# Patient Record
Sex: Female | Born: 1935 | Race: White | Hispanic: No | Smoking: Never smoker
Health system: Southern US, Community
[De-identification: ages and names within clinical notes are randomized; demographics above are authoritative.]

## PROBLEM LIST (undated history)

## (undated) DIAGNOSIS — M4800 Spinal stenosis, site unspecified: Secondary | ICD-10-CM

## (undated) DIAGNOSIS — G894 Chronic pain syndrome: Secondary | ICD-10-CM

## (undated) DIAGNOSIS — G20A1 Parkinson's disease without dyskinesia, without mention of fluctuations: Secondary | ICD-10-CM

## (undated) DIAGNOSIS — G629 Polyneuropathy, unspecified: Secondary | ICD-10-CM

## (undated) DIAGNOSIS — F0390 Unspecified dementia without behavioral disturbance: Secondary | ICD-10-CM

## (undated) DIAGNOSIS — I10 Essential (primary) hypertension: Secondary | ICD-10-CM

## (undated) DIAGNOSIS — K589 Irritable bowel syndrome without diarrhea: Secondary | ICD-10-CM

## (undated) DIAGNOSIS — M810 Age-related osteoporosis without current pathological fracture: Secondary | ICD-10-CM

## (undated) DIAGNOSIS — I639 Cerebral infarction, unspecified: Secondary | ICD-10-CM

## (undated) DIAGNOSIS — G20 Parkinson's disease: Secondary | ICD-10-CM

## (undated) HISTORY — DX: Essential (primary) hypertension: I10

## (undated) HISTORY — DX: Unspecified dementia, unspecified severity, without behavioral disturbance, psychotic disturbance, mood disturbance, and anxiety: F03.90

## (undated) HISTORY — DX: Cerebral infarction, unspecified: I63.9

## (undated) HISTORY — PX: ABDOMINAL HYSTERECTOMY: SHX81

## (undated) HISTORY — DX: Parkinson's disease: G20

## (undated) HISTORY — DX: Age-related osteoporosis without current pathological fracture: M81.0

## (undated) HISTORY — DX: Irritable bowel syndrome, unspecified: K58.9

## (undated) HISTORY — DX: Spinal stenosis, site unspecified: M48.00

## (undated) HISTORY — DX: Polyneuropathy, unspecified: G62.9

## (undated) HISTORY — DX: Chronic pain syndrome: G89.4

## (undated) HISTORY — DX: Parkinson's disease without dyskinesia, without mention of fluctuations: G20.A1

## (undated) HISTORY — PX: TUBAL LIGATION: SHX77

## (undated) HISTORY — PX: TONSILLECTOMY: SUR1361

---

## 2014-06-02 ENCOUNTER — Ambulatory Visit (INDEPENDENT_AMBULATORY_CARE_PROVIDER_SITE_OTHER): Payer: Medicare Other | Admitting: Podiatry

## 2014-06-02 ENCOUNTER — Encounter: Payer: Self-pay | Admitting: Podiatry

## 2014-06-02 VITALS — BP 146/87 | HR 79 | Resp 15 | Ht 60.0 in | Wt 114.0 lb

## 2014-06-02 DIAGNOSIS — B351 Tinea unguium: Secondary | ICD-10-CM | POA: Diagnosis not present

## 2014-06-02 DIAGNOSIS — M79676 Pain in unspecified toe(s): Secondary | ICD-10-CM

## 2014-06-02 NOTE — Progress Notes (Signed)
   Subjective:    Patient ID: Penny Robbins, female    DOB: [birth year], 81 y.o.   MRN: [id number]  HPI Comments: N debridement L 10 toenails D and O long-term C elongated, tender toenails A Parkinson's disease, peripheral neuropathy T hx of pedicare by orthopedic in October 2015   Patient has [place] to [place] and living in assisted living facility History of podiatric care while living in [place]  Review of Systems  All other systems reviewed and are negative.      Objective:   Physical Exam   Orientated 3 Patient transfer from wheelchair to treatment chair  Vascular: DP pulses 2/4 bilaterally PT pulses 0/4 bilaterally  Neurological: Sensation to 10 g monofilament wire intact 3/5 right and 5/5 left Vibratory sensation nonreactive bilaterally Ankle reflex equal and reactive bilaterally  Dermatological: The toenails are elongated, hypertrophic, incurvated and tender to palpation 6-10  Musculoskeletal: Hammertoe deformities 2-5 bilaterally Manual motor testing dorsi flexion and plantar flexion 5/5 bilaterally Unstable gait      Assessment & Plan:   Assessment: Peripheral neuropathy Diminished pedal pulses suggestive of possible peripheral arterial disease bilaterally Symptomatic onychomycoses 6-10  Plan: Debridement toenails 10 without a bleeding  Reappoint 3 months

## 2014-07-07 ENCOUNTER — Encounter: Payer: Self-pay | Admitting: *Deleted

## 2014-07-08 ENCOUNTER — Ambulatory Visit (INDEPENDENT_AMBULATORY_CARE_PROVIDER_SITE_OTHER): Payer: Medicare Other | Admitting: Neurology

## 2014-07-08 ENCOUNTER — Encounter: Payer: Self-pay | Admitting: Neurology

## 2014-07-08 VITALS — BP 102/61 | HR 80 | Ht 60.0 in | Wt 113.0 lb

## 2014-07-08 DIAGNOSIS — G20 Parkinson's disease: Secondary | ICD-10-CM

## 2014-07-08 DIAGNOSIS — R269 Unspecified abnormalities of gait and mobility: Secondary | ICD-10-CM | POA: Insufficient documentation

## 2014-07-08 DIAGNOSIS — F03918 Unspecified dementia, unspecified severity, with other behavioral disturbance: Secondary | ICD-10-CM | POA: Insufficient documentation

## 2014-07-08 DIAGNOSIS — F0391 Unspecified dementia with behavioral disturbance: Secondary | ICD-10-CM

## 2014-07-08 MED ORDER — QUETIAPINE FUMARATE 25 MG PO TABS: ORAL_TABLET | ORAL | Status: DC

## 2014-07-08 NOTE — Progress Notes (Signed)
PATIENT: Penny GumJoanna Eager DOB: Oct 05, 1924  HISTORICAL  Penny Robbins is a 81 yo RH female referred by her PCP Dr, Nadyne CoombesKaren Richter, accompanied by her son Penny Robbins to establish neurological care  She has a history of Parkinson's disease, diagnosis was made in early 1990s, she presented with right hand tremor, slow progression of the years, she has developed gradual worsening gait difficulty, since she fell, broke her left hip around 2013, she had much worsening gait difficulty, went from ambulate without assistant to walker, now require wheelchair for longer distance  She had 16 years of education, was a housewife, used to live in [place] with her husband, who passed away in October 2015, she [facility],   She was under the care of neurologist at [place], is taking Sinemet 25 mg/100 mg 3 times a day, over the past few months, she was noted to have increased gait difficulty, tendency to fall,  She also had a gradual onset memory trouble over the years, visual hallucination, to the point of calling 911 to her house, she continue have almost daily visual hallucinations, sometimes scary nightmares, could not tell reality from her dreams,  She has good appetite, evening time sundowning, mild to sleeping difficulty, nocturia, she was given gabapentin 100 mg every night few weeks ago, complains of mental foggy, wet her bed for the first time,  REVIEW OF SYSTEMS: Full 14 system review of systems performed and notable only for as above  ALLERGIES: Allergies  Allergen Reactions  . Benicar [Olmesartan]   . Paxil [Paroxetine Hcl]   . Vasotec [Enalaprilat]     HOME MEDICATIONS: Current Outpatient Prescriptions  Medication Sig  . amLODipine (NORVASC) 5 MG tablet Take 5 mg by mouth daily.  Marland Kitchen. aspirin 81 MG tablet Take by mouth daily.  . calcium carbonate (TUMS - DOSED IN MG ELEMENTAL CALCIUM) 500 MG chewable tablet Chew 1 tablet by mouth daily.  .  Calcium-Vitamin D-Vitamin K (VIACTIV PO) Take by mouth.  . carbidopa-levodopa (SINEMET IR) 25-100 MG per tablet Take 1 tablet by mouth daily.   . carvedilol (COREG) 12.5 MG tablet Take 12.5 mg by mouth 3 (three) times daily.   . Cholecalciferol (VITAMIN D PO) Take 1,000 mg by mouth.   . gabapentin (NEURONTIN) 100 MG capsule Take 100 mg by mouth at bedtime.  Marland Kitchen. glycerin adult 2 G SUPP Place 1 suppository rectally as needed for moderate constipation.  Marland Kitchen. LORazepam (ATIVAN) 0.5 MG tablet Take 0.5 mg by mouth every 8 (eight) hours as needed.   . Multiple Vitamins-Minerals (I-VITE PO) Take by mouth.  Marland Kitchen. oxymorphone (OPANA) 5 MG tablet Take 5 mg by mouth every 12 (twelve) hours.  . pantoprazole (PROTONIX) 20 MG tablet Take 20 mg by mouth daily.  . polyethylene glycol (MIRALAX / GLYCOLAX) packet Take 17 g by mouth daily.  . potassium chloride (K-DUR,KLOR-CON) 10 MEQ tablet Take 10 mEq by mouth daily.   . potassium chloride (MICRO-K) 10 MEQ CR capsule Take 10 mEq by mouth 2 (two) times daily.  . vitamin B-12 (CYANOCOBALAMIN) 500 MCG tablet Take 500 mcg by mouth daily.     PAST MEDICAL HISTORY: Past Medical History  Diagnosis Date  . Hypertension   . Osteoporosis   . IBS (irritable bowel syndrome)   . Parkinson disease   . Peripheral neuropathy   . Chronic pain syndrome   . Spinal stenosis   . Dementia     PAST SURGICAL HISTORY: Past Surgical  History  Procedure Laterality Date  . Abdominal hysterectomy    . Tubal ligation    . Tonsillectomy      FAMILY HISTORY: No family history on file.  SOCIAL HISTORY:  History   Social History  . Marital Status: Widowed    Spouse Name: N/A  . Number of Children: 3  . Years of Education: 14   Occupational History  . House wife   Social History Main Topics  . Smoking status: Never Smoker   . Smokeless tobacco: Not on file  . Alcohol Use: Not on file  . Drug Use: Not on file  . Sexual Activity: Not on file   Other Topics Concern  .  Not on file    PHYSICAL EXAM   Filed Vitals:   07/08/14 1042  BP: 102/61  Pulse: 80  Height: 5' (1.524 m)  Weight: 113 lb (51.256 kg)    Not recorded      Body mass index is 22.07 kg/(m^2).  PHYSICAL EXAMNIATION:  Gen: NAD, conversant, well nourised, obese, well groomed                     Cardiovascular: Regular rate rhythm, no peripheral edema, warm, nontender. Eyes: Conjunctivae clear without exudates or hemorrhage Neck: Supple, no carotid bruise. Pulmonary: Clear to auscultation bilaterally   NEUROLOGICAL EXAM:  MENTAL STATUS: Speech:    Speech is normal; fluent and spontaneous with normal comprehension.  Cognition:    The patient is oriented to person, place, and time;     recent and remote memory intact;     language fluent;     normal attention, concentration,     fund of knowledge.  CRANIAL NERVES: CN II: Visual fields are full to confrontation. Fundoscopic exam is normal with sharp discs and no vascular changes. Venous pulsations are present bilaterally. Pupils are 4 mm and briskly reactive to light. Visual acuity is 20/20 bilaterally. CN III, IV, VI: extraocular movement are normal. No ptosis. CN V: Facial sensation is intact to pinprick in all 3 divisions bilaterally. Corneal responses are intact.  CN VII: Face is symmetric with normal eye closure and smile. CN VIII: Hearing is normal to rubbing fingers CN IX, X: Palate elevates symmetrically. Phonation is normal. CN XI: Head turning and shoulder shrug are intact CN XII: Tongue is midline with normal movements and no atrophy.  MOTOR: She has mild limb and nuchal rigidity, right more than left, no significant weakness, while specificity,   REFLEXES: Reflexes are 2+ and symmetric at the biceps, triceps, knees, and ankles. Plantar responses are flexor.  SENSORY: Length dependent decreased Light touch, pinprick, position sense, and vibration sense at toes   COORDINATION: Rapid alternating movements  and fine finger movements are intact. There is no dysmetria on finger-to-nose and heel-knee-shin. There are no abnormal or extraneous movements.   GAIT/STANCE:  She need assistant to get up from seated position, cautious, mildly unsteady,   DIAGNOSTIC DATA (LABS, IMAGING, TESTING) - I reviewed patient records, labs, notes, testing and imaging myself where available.    ASSESSMENT AND PLAN  Dawsyn Zurn is a 81 y.o. female  with long-standing history of Parkinson's disease, progressive worsening gait difficulty, worsening memory trouble, sundowning phenomenon, visual hallucinations, difficulty sleeping,  1, moderate exercise, physical therapy 2, keep current dose of Sinemet 25/100 mg 3 times a day 3. Seroquel 25 mg half to one tablets every night 4, return to clinic in one month    No orders of the  defined types were placed in this encounter.    New Prescriptions   QUETIAPINE (SEROQUEL) 25 MG TABLET    1/2 tab po qhs xone week, then one tab po qhs    There are no discontinued medications.  Return in about 1 month (around 08/07/2014). Levert Feinstein, M.D. Ph.D.  [facility] [address], [address] [place], [place] [address] Ph: [phone] Fax: [phone]

## 2014-07-15 ENCOUNTER — Emergency Department (HOSPITAL_COMMUNITY): Payer: Medicare Other

## 2014-07-15 ENCOUNTER — Emergency Department (HOSPITAL_COMMUNITY)
Admission: EM | Admit: 2014-07-15 | Discharge: 2014-07-15 | Disposition: A | Discharge status: Home / Self Care | Payer: Medicare Other | Attending: Emergency Medicine | Admitting: Emergency Medicine

## 2014-07-15 ENCOUNTER — Encounter (HOSPITAL_COMMUNITY): Payer: Self-pay | Admitting: Emergency Medicine

## 2014-07-15 DIAGNOSIS — Z79899 Other long term (current) drug therapy: Secondary | ICD-10-CM | POA: Diagnosis not present

## 2014-07-15 DIAGNOSIS — Y9289 Other specified places as the place of occurrence of the external cause: Secondary | ICD-10-CM | POA: Diagnosis not present

## 2014-07-15 DIAGNOSIS — S4991XA Unspecified injury of right shoulder and upper arm, initial encounter: Secondary | ICD-10-CM | POA: Diagnosis present

## 2014-07-15 DIAGNOSIS — Y9389 Activity, other specified: Secondary | ICD-10-CM | POA: Insufficient documentation

## 2014-07-15 DIAGNOSIS — Y998 Other external cause status: Secondary | ICD-10-CM | POA: Insufficient documentation

## 2014-07-15 DIAGNOSIS — W010XXA Fall on same level from slipping, tripping and stumbling without subsequent striking against object, initial encounter: Secondary | ICD-10-CM | POA: Insufficient documentation

## 2014-07-15 DIAGNOSIS — Z7982 Long term (current) use of aspirin: Secondary | ICD-10-CM | POA: Diagnosis not present

## 2014-07-15 DIAGNOSIS — I10 Essential (primary) hypertension: Secondary | ICD-10-CM | POA: Insufficient documentation

## 2014-07-15 DIAGNOSIS — S42001A Fracture of unspecified part of right clavicle, initial encounter for closed fracture: Secondary | ICD-10-CM | POA: Insufficient documentation

## 2014-07-15 DIAGNOSIS — Z8673 Personal history of transient ischemic attack (TIA), and cerebral infarction without residual deficits: Secondary | ICD-10-CM | POA: Diagnosis not present

## 2014-07-15 DIAGNOSIS — S81801A Unspecified open wound, right lower leg, initial encounter: Secondary | ICD-10-CM | POA: Diagnosis not present

## 2014-07-15 DIAGNOSIS — G629 Polyneuropathy, unspecified: Secondary | ICD-10-CM | POA: Insufficient documentation

## 2014-07-15 DIAGNOSIS — G3183 Dementia with Lewy bodies: Secondary | ICD-10-CM | POA: Insufficient documentation

## 2014-07-15 DIAGNOSIS — G894 Chronic pain syndrome: Secondary | ICD-10-CM | POA: Diagnosis not present

## 2014-07-15 DIAGNOSIS — Z792 Long term (current) use of antibiotics: Secondary | ICD-10-CM | POA: Diagnosis not present

## 2014-07-15 DIAGNOSIS — S81811A Laceration without foreign body, right lower leg, initial encounter: Secondary | ICD-10-CM

## 2014-07-15 DIAGNOSIS — W19XXXA Unspecified fall, initial encounter: Secondary | ICD-10-CM

## 2014-07-15 MED ORDER — HYDROCODONE-ACETAMINOPHEN 5-325 MG PO TABS: 1.0000 | ORAL_TABLET | Freq: Four times a day (QID) | ORAL | Status: AC | PRN

## 2014-07-15 NOTE — Progress Notes (Signed)
CSW was notified by nurse that the pt is up for discharge and that the family would like to speak with her.  CSW spoke with pt and family at bedside. Son confirms that [facility]. Also, he confirms [facility]. He states that the is the 1st time the pt has fallen within the past 6 months.   Son informed CSW that the pt has a fractured clavicle and that her arm is in a sling. Son expressed that he is concerned that the pt will not be able to receive the appropriate care [facility]. He states that he feels as though the pt would receive [facility].  CSW informed son that she would reach out to the facility and share his concerns.  CSW called the facility who informed CSW that they would be able to take care of the pt with a fractured clavicle. They also informed CSW that OT and PT could be ordered. [facility] to PT and OT being ordered for the pt.  CSW gave the family a list of SNF and ALF. Son informed CSW that he is the pt's primary support and POA and that he lives in [place].  Son/Donnie [phone]  Trish MageBrittney Kayceon Oki, LCSWA [phone] ED CSW 07/15/2014 11:28 PM

## 2014-07-15 NOTE — ED Provider Notes (Signed)
CSN: [id number]     Arrival date & time 07/15/14  1359 History   First MD Initiated Contact with Patient 07/15/14 1511     Chief Complaint  Patient presents with  . Fall     (Consider location/radiation/quality/duration/timing/severity/associated sxs/prior Treatment) HPI Patient presents to the emergency department following a fall that occurred just prior to arrival.  Patient states that she tripped over a threshold of a doorway in her room at the assisted living facility.  The patient states she does not believe she hit her head and did not lose consciousness.  Patient, states she is having pain in her right shoulder and right lower leg.  Patient states that she did not take any medications prior to arrival.  Patient states that she is a walker for ambulation.  Patient denies chest pain, shortness of breath, headache, blurred vision, neck pain, back pain, nausea, vomiting, or syncope.  The patient states that movement and palpation make her pain worse Past Medical History  Diagnosis Date  . Hypertension   . Stroke   . Osteoporosis   . IBS (irritable bowel syndrome)   . Parkinson disease   . Peripheral neuropathy   . Chronic pain syndrome   . Spinal stenosis   . Dementia    Past Surgical History  Procedure Laterality Date  . Abdominal hysterectomy    . Tubal ligation    . Tonsillectomy     Family History  Problem Relation Age of Onset  . Healthy Mother   . Tuberculosis Father    History  Substance Use Topics  . Smoking status: Never Smoker   . Smokeless tobacco: Not on file  . Alcohol Use: No   OB History    No data available     Review of Systems  All other systems negative except as documented in the HPI. All pertinent positives and negatives as reviewed in the HPI.  Allergies  Benicar; Paxil; and Vasotec  Home Medications   Prior to Admission medications   Medication Sig Start Date End Date Taking? Authorizing Provider  amLODipine (NORVASC) 5 MG tablet Take  5 mg by mouth 2 (two) times daily.    Yes Historical Provider, MD  ARIPiprazole (ABILIFY) 2 MG tablet Take 1 mg by mouth at bedtime.   Yes Historical Provider, MD  aspirin 81 MG chewable tablet Chew 81 mg by mouth daily after breakfast.   Yes Historical Provider, MD  bisacodyl (DULCOLAX) 10 MG suppository Place 10 mg rectally daily as needed for moderate constipation.   Yes Historical Provider, MD  carbidopa-levodopa (SINEMET IR) 25-100 MG per tablet Take 1 tablet by mouth 3 (three) times daily.    Yes Historical Provider, MD  carvedilol (COREG) 12.5 MG tablet Take 12.5 mg by mouth 2 (two) times daily with a meal.    Yes Historical Provider, MD  Cholecalciferol (VITAMIN D3 SUPER STRENGTH PO) Take 2,000 mg by mouth daily after breakfast.   Yes Historical Provider, MD  clindamycin (CLEOCIN T) 1 % lotion Apply 1 application topically 2 (two) times daily. Self administers   Yes Historical Provider, MD  Cyanocobalamin (VITAMIN B-12) 1000 MCG SUBL Place 1,000 mcg under the tongue daily.   Yes Historical Provider, MD  docusate sodium (COLACE) 100 MG capsule Take 100 mg by mouth 2 (two) times daily.   Yes Historical Provider, MD  famotidine (PEPCID) 20 MG tablet Take 20 mg by mouth 2 (two) times daily.   Yes Historical Provider, MD  gabapentin (NEURONTIN) 100 MG capsule  Take 200 mg by mouth at bedtime.    Yes Historical Provider, MD  glycerin adult 2 G SUPP Place 1 suppository rectally as needed for moderate constipation.   Yes Historical Provider, MD  LORazepam (ATIVAN) 0.5 MG tablet Take 0.5 mg by mouth every 8 (eight) hours as needed.    Yes Historical Provider, MD  meclizine (ANTIVERT) 12.5 MG tablet Take 12.5 mg by mouth every 8 (eight) hours as needed for dizziness.   Yes Historical Provider, MD  Multiple Vitamins-Minerals (I-VITE PO) Take 1 tablet by mouth daily after breakfast.    Yes Historical Provider, MD  oxymorphone (OPANA ER) 5 MG 12 hr tablet Take 5 mg by mouth every 12 (twelve) hours.   Yes  Historical Provider, MD  polyethylene glycol powder (GLYCOLAX/MIRALAX) powder Take 17 g by mouth 2 (two) times daily as needed for mild constipation.   Yes Historical Provider, MD  potassium chloride (MICRO-K) 10 MEQ CR capsule Take 10-20 mEq by mouth daily after breakfast. Takes 10 mEq Sunday, Tuesday, Thursday, and Saturday  Takes 20 mEq Monday, Wednesday, and Friday   Yes Historical Provider, MD  psyllium (METAMUCIL) 58.6 % powder Take 1 packet by mouth daily.   Yes Historical Provider, MD  QUEtiapine (SEROQUEL) 25 MG tablet 1/2 tab po qhs xone week, then one tab po qhs Patient taking differently: Take 12.5-25 mg by mouth at bedtime. 1/2 tab po qhs xone week, then one tab po qhs 07/08/14  Yes Levert Feinstein, MD   BP 160/78 mmHg  Pulse 70  Temp(Src) 98 F (36.7 C) (Oral)  Resp 18  SpO2 98% Physical Exam  Constitutional: She is oriented to person, place, and time. She appears well-developed and well-nourished. No distress.  HENT:  Head: Normocephalic and atraumatic.  Mouth/Throat: Oropharynx is clear and moist.  Eyes: EOM are normal. Pupils are equal, round, and reactive to light.  Neck: Normal range of motion. Neck supple.  Cardiovascular: Normal rate, regular rhythm and normal heart sounds.  Exam reveals no gallop and no friction rub.   No murmur heard. Pulmonary/Chest: Breath sounds normal. No respiratory distress. She exhibits no tenderness.  Musculoskeletal:       Right shoulder: She exhibits decreased range of motion, tenderness and pain. She exhibits no swelling.       Legs: Neurological: She is alert and oriented to person, place, and time. She exhibits normal muscle tone. Coordination normal.  Skin: Skin is warm and dry. No rash noted. No erythema.  Nursing note and vitals reviewed.   ED Course  Procedures (including critical care time) Labs Review Labs Reviewed - No data to display  Imaging Review Dg Pelvis 1-2 Views  07/15/2014   CLINICAL DATA:  Unwitnessed fall.  No loss  of consciousness.  EXAM: PELVIS - 1-2 VIEW  COMPARISON:  None.  FINDINGS: Old left proximal femoral fracture transfixed with an intra medullary nail and interlocking femoral neck screw without failure or complication. No acute fracture or dislocation. No lytic or sclerotic osseous lesion. Levocurvature of the lumbar spine. Generalized osteopenia.  IMPRESSION: 1. No acute osseous injury of the pelvis.   Electronically Signed   By: Elige Ko   On: 07/15/2014 16:12   Dg Shoulder Right  07/15/2014   CLINICAL DATA:  Unwitnessed fall, slipped on carpet, no loss of consciousness  EXAM: RIGHT SHOULDER - 2+ VIEW  COMPARISON:  None  FINDINGS: Osseous demineralization.  Question displaced distal RIGHT clavicular fracture, region poorly visualized.  Glenohumeral alignment normal.  No glenohumeral  fracture or dislocation.  Visualized RIGHT ribs intact.  Prior spinal augmentation procedures at 4 contiguous levels of the thoracic spine.  IMPRESSION: Osseous demineralization with question minimally displaced distal RIGHT clavicular fracture ; recommend correlation for pain/tenderness at this site.   Electronically Signed   By: Ulyses SouthwardMark  Boles M.D.   On: 07/15/2014 16:12   Dg Tibia/fibula Right  07/15/2014   CLINICAL DATA:  Per EMS patient [facility] unwitnessed [facility]. Slipped on the carpet. No loss of consciousness. Patient recalls the fall. Right ankle and right shoulder pain. History of osteoporosis and peripheral neuropathy.  EXAM: RIGHT TIBIA AND FIBULA - 2 VIEW  COMPARISON:  None.  FINDINGS: Bones appear osteopenic. There is no evidence for acute fracture or dislocation. There is diffuse soft tissue swelling. There is atherosclerotic calcification of the arteries.  IMPRESSION: 1. Soft tissue swelling. 2.  No evidence for acute osseous abnormality.   Electronically Signed   By: Norva PavlovElizabeth  Brown M.D.   On: 07/15/2014 16:16   Ct Head Wo Contrast  07/15/2014   CLINICAL DATA:  [facility]. Pt  had unwitnessed fall. Patient slipped on carpet, no LOC and pt remembers fall.  EXAM: CT HEAD WITHOUT CONTRAST  CT CERVICAL SPINE WITHOUT CONTRAST  TECHNIQUE: Multidetector CT imaging of the head and cervical spine was performed following the standard protocol without intravenous contrast. Multiplanar CT image reconstructions of the cervical spine were also generated.  COMPARISON:  None.  FINDINGS: CT HEAD FINDINGS  There is moderate central and cortical atrophy. Periventricular white matter changes are consistent with small vessel disease. There is no intra or extra-axial fluid collection or mass lesion. The basilar cisterns and ventricles have a normal appearance. There is no CT evidence for acute infarction or hemorrhage. There is a laceration involving the right frontal scalp. No underlying calvarial fracture. Significant atherosclerotic calcification of the internal carotid arteries. Paranasal and mastoid air cells are normally aerated.  CT CERVICAL SPINE FINDINGS  There is moderate mid cervical degenerative change. There is 2 mm of anterolisthesis C3 on C4, likely degenerative. Significant disc height loss identified at C4-5, C5-6, C6-7, and C7-T1. There is significant facet hypertrophy at these same levels. No evidence for acute fracture or traumatic subluxation. Lung apices show fibrotic changes and focal air trapping. There is significant atherosclerotic calcification of the aortic arch and its branches. Significant carotid bulb calcification bilaterally, left greater than right.  IMPRESSION: 1. Atrophy and small vessel disease. 2. Right frontal scalp laceration.  No calvarial fracture. 3. Moderate cervical spondylosis. 4.  No evidence for acute cervical spine abnormality. 5. Moderate atherosclerosis.   Electronically Signed   By: Norva PavlovElizabeth  Brown M.D.   On: 07/15/2014 16:31   Ct Cervical Spine Wo Contrast  07/15/2014   CLINICAL DATA:  [facility] Pt had unwitnessed fall. Patient slipped  on carpet, no LOC and pt remembers fall.  EXAM: CT HEAD WITHOUT CONTRAST  CT CERVICAL SPINE WITHOUT CONTRAST  TECHNIQUE: Multidetector CT imaging of the head and cervical spine was performed following the standard protocol without intravenous contrast. Multiplanar CT image reconstructions of the cervical spine were also generated.  COMPARISON:  None.  FINDINGS: CT HEAD FINDINGS  There is moderate central and cortical atrophy. Periventricular white matter changes are consistent with small vessel disease. There is no intra or extra-axial fluid collection or mass lesion. The basilar cisterns and ventricles have a normal appearance. There is no CT evidence for acute infarction or hemorrhage. There is  a laceration involving the right frontal scalp. No underlying calvarial fracture. Significant atherosclerotic calcification of the internal carotid arteries. Paranasal and mastoid air cells are normally aerated.  CT CERVICAL SPINE FINDINGS  There is moderate mid cervical degenerative change. There is 2 mm of anterolisthesis C3 on C4, likely degenerative. Significant disc height loss identified at C4-5, C5-6, C6-7, and C7-T1. There is significant facet hypertrophy at these same levels. No evidence for acute fracture or traumatic subluxation. Lung apices show fibrotic changes and focal air trapping. There is significant atherosclerotic calcification of the aortic arch and its branches. Significant carotid bulb calcification bilaterally, left greater than right.  IMPRESSION: 1. Atrophy and small vessel disease. 2. Right frontal scalp laceration.  No calvarial fracture. 3. Moderate cervical spondylosis. 4.  No evidence for acute cervical spine abnormality. 5. Moderate atherosclerosis.   Electronically Signed   By: Norva Pavlov M.D.   On: 07/15/2014 16:31   I repaired the skin tear with Steri-Strips.  I advised the patient to follow-up with her primary care doctor and orthopedics   The patient lives in an  assisted-living and the case manager saw the patient and gave him a home health follow-up with OT and PT. Marland Kitchen  Patient is advised follow-up with her primary care Dr. told to return here as needed  Charlestine Night, PA-C 07/16/14 0003  Eber Hong, MD 07/16/14 1124

## 2014-07-15 NOTE — ED Provider Notes (Signed)
The patient is an 81 year old female who had a fall, she complains of right shoulder pain, has some abrasions and skin tears to her arms and legs, on exam she has tenderness over the distal third of the right clavicle, x-rays confirm clavicle fracture, sling, back to her nursing facility, no other indications for further imaging. The patient has a normal mental status, clear heart and lung sounds and has normal neurologic exam. Family members informed of results and indications for return.  Medical screening examination/treatment/procedure(s) were conducted as a shared visit with non-physician practitioner(s) and myself.  I personally evaluated the patient during the encounter.  Clinical Impression:   Final diagnoses:  Fall, initial encounter  Skin tear of lower leg without complication, right, initial encounter  Clavicle fracture, right, closed, initial encounter         Eber HongBrian Wendee Hata, MD 07/16/14 1123

## 2014-07-15 NOTE — Progress Notes (Addendum)
[facility] as patient's son reports patient needs to go to a SNF.  Patient is 81 year old female who fell at nursing ALF [facility] resulting in fractured clavicle.  Sling has been applied in ED.  Patient usually ambulates with a walker at ALF.  EDSW has called facility and per facility staff, they will be able to take care of this patient with fractured clavicle and sling and that they will require an order for PT if needed, that facility will provide therapy for patient.   EDCM and EDSW spoke to patient and patient's family at bedside.  Patient's son Donnie at bedside.   Patient's son agreeable to PT OT at facility.  EDCM assessed for dme needs.  Patient's son reports they have a wheelchair for patient and patient has a walker.  Patient's son agreeable to have bedside commode delivered to [facility].  No other dme needs at this time. Encouraged patient's son to have wheelchair brought to facility. Patient's [facility]  EDCM explained Medicare guidelines regarding approval for SNF.  EDCM provided patient's son with list of private duty nursing agencies, explained services and that it would be an out of pocket expense.  Discussed patient with EDP who placed orders for PT,OT.  Orders placed with patient's chart with paper clip to attach to other paperwork to go back to facility.  [facility] with confirmation of receipt at 1914pm.  No further EDCM needs at this time.  07/16/2014 A. Brigid Vandekamp RNCM 1812pm  EDCM called patient's son Dema SeverinDonny for follow up.  EDCM asked patient's son how his mother was doing?  Patient's son responded, "She's actually doing pretty well."  Patient's son reports staff at nursing facility are currently helping her go to the bathroom.  He reports [facility] stated bedside commode would not arrive at nursing facility for a couple of days.  Patient's [facility].   Patient's son reports, "Things are going better than what I thought" in regards to nursing facility.  Patient's son thankful for follow up phone call.  No further EDCM needs at this time.

## 2014-07-15 NOTE — Discharge Instructions (Signed)
Return here as needed.  Follow-up with the orthopedist provided.  Keep the skin tear on her right leg clean and dry.  The Steri-Strips will fall off on their own.  Follow-up with your primary care doctor

## 2014-07-15 NOTE — ED Notes (Signed)
Per EMS: pt from [facility], pt had unwitnessed fall, pt slipped on carpet, no LOC and pt remembers fall. No blood thinners. Staff states pt is at normal baseline. Pain to right shoulder and chin.

## 2014-07-15 NOTE — ED Notes (Signed)
Bed: WA17 Expected date:  Expected time:  Means of arrival:  Comments: EMS fall 

## 2014-08-07 ENCOUNTER — Encounter: Payer: Self-pay | Admitting: Neurology

## 2014-08-07 ENCOUNTER — Ambulatory Visit (INDEPENDENT_AMBULATORY_CARE_PROVIDER_SITE_OTHER): Payer: Medicare Other | Admitting: Neurology

## 2014-08-07 VITALS — BP 104/61 | HR 79 | Ht 60.0 in | Wt 113.0 lb

## 2014-08-07 DIAGNOSIS — G20 Parkinson's disease: Secondary | ICD-10-CM | POA: Diagnosis not present

## 2014-08-07 DIAGNOSIS — R269 Unspecified abnormalities of gait and mobility: Secondary | ICD-10-CM | POA: Diagnosis not present

## 2014-08-07 DIAGNOSIS — F0391 Unspecified dementia with behavioral disturbance: Secondary | ICD-10-CM | POA: Diagnosis not present

## 2014-08-07 DIAGNOSIS — F03918 Unspecified dementia, unspecified severity, with other behavioral disturbance: Secondary | ICD-10-CM

## 2014-08-07 MED ORDER — MEMANTINE HCL 10 MG PO TABS: 10.0000 mg | ORAL_TABLET | Freq: Two times a day (BID) | ORAL | Status: AC

## 2014-08-07 MED ORDER — QUETIAPINE FUMARATE 25 MG PO TABS: ORAL_TABLET | ORAL | Status: AC

## 2014-08-07 NOTE — Progress Notes (Signed)
Chief Complaint  Patient presents with  . Parkinson's Disease    She is unsure if her symptoms have improved with Seroquel.  Her hallucinations are better but still present.  She had a fall three weeks ago and broke her right clavicle.  She is not longer walking without assistance.      PATIENT: Penny Robbins DOB: 1925/03/22  HISTORICAL  Elijah Michaelis is a 81 yo RH female referred by her PCP Dr, Nadyne Coombes, accompanied by her son Dema Severin to establish neurological care  She has a history of Parkinson's disease, diagnosis was made in early 1990s, she presented with right hand tremor, slow progression of the years, she has developed gradual worsening gait difficulty, since she fell, broke her left hip around 2013, she had much worsening gait difficulty, went from ambulate without assistant to walker, now require wheelchair for longer distance  She had 16 years of education, was a housewife, used to live in [place] with her husband, who passed away in 10-29-2015she moved to current assistant living, [facility] since November 2015,   She was under the care of neurologist at [place], is taking Sinemet 25 mg/100 mg 3 times a day, over the past few months, she was noted to have increased gait difficulty, tendency to fall,  She also had a gradual onset memory trouble over the years, visual hallucination, to the point of calling 911 to her house, she continue have almost daily visual hallucinations, sometimes scary nightmares, could not tell reality from her dreams,  She has good appetite, evening time sundowning, mild to sleeping difficulty, nocturia, she was given gabapentin 100 mg every night few weeks ago, complains of mental foggy, wet her bed for the first time,  UPDATE Aug 07 2014: She fell, had increased gait difficulty, and fear of walking, also complains of right side low back pain, urinary urgency, She started PT recently   She is taking Sinemet 25/100 mg 3 times a day, seroquel,  and also abilify  REVIEW OF SYSTEMS: Full 14 system review of systems performed and notable only for as above  ALLERGIES: Allergies  Allergen Reactions  . Benicar [Olmesartan]     Unknown reaction per MAR   . Paxil [Paroxetine Hcl]     Unknown reaction per MAR   . Vasotec [Enalaprilat]     Unknown reaction per [facility]     HOME MEDICATIONS: Current Outpatient Prescriptions  Medication Sig  . amLODipine (NORVASC) 5 MG tablet Take 5 mg by mouth daily.  Marland Kitchen aspirin 81 MG tablet Take by mouth daily.  . calcium carbonate (TUMS - DOSED IN MG ELEMENTAL CALCIUM) 500 MG chewable tablet Chew 1 tablet by mouth daily.  . Calcium-Vitamin D-Vitamin K (VIACTIV PO) Take by mouth.  . carbidopa-levodopa (SINEMET IR) 25-100 MG per tablet Take 1 tablet by mouth daily.   . carvedilol (COREG) 12.5 MG tablet Take 12.5 mg by mouth 3 (three) times daily.   . Cholecalciferol (VITAMIN D PO) Take 1,000 mg by mouth.   . gabapentin (NEURONTIN) 100 MG capsule Take 100 mg by mouth at bedtime.  Marland Kitchen glycerin adult 2 G SUPP Place 1 suppository rectally as needed for moderate constipation.  Marland Kitchen LORazepam (ATIVAN) 0.5 MG tablet Take 0.5 mg by mouth every 8 (eight) hours as needed.   . Multiple Vitamins-Minerals (I-VITE PO) Take by mouth.  Marland Kitchen oxymorphone (OPANA) 5 MG tablet Take 5 mg by mouth every 12 (twelve) hours.  . pantoprazole (PROTONIX) 20 MG tablet Take 20 mg by  mouth daily.  . polyethylene glycol (MIRALAX / GLYCOLAX) packet Take 17 g by mouth daily.  . potassium chloride (K-DUR,KLOR-CON) 10 MEQ tablet Take 10 mEq by mouth daily.   . potassium chloride (MICRO-K) 10 MEQ CR capsule Take 10 mEq by mouth 2 (two) times daily.  . vitamin B-12 (CYANOCOBALAMIN) 500 MCG tablet Take 500 mcg by mouth daily.     PAST MEDICAL HISTORY: Past Medical History  Diagnosis Date  . Hypertension   . Osteoporosis   . IBS (irritable bowel syndrome)   . Parkinson disease   . Peripheral neuropathy   . Chronic pain syndrome   . Spinal  stenosis   . Dementia     PAST SURGICAL HISTORY: Past Surgical History  Procedure Laterality Date  . Abdominal hysterectomy    . Tubal ligation    . Tonsillectomy      FAMILY HISTORY: Family History  Problem Relation Age of Onset  . Healthy Mother   . Tuberculosis Father     SOCIAL HISTORY:  History   Social History  . Marital Status: Widowed    Spouse Name: N/A  . Number of Children: 3  . Years of Education: 14   Occupational History  . House wife   Social History Main Topics  . Smoking status: Never Smoker   . Smokeless tobacco: Not on file  . Alcohol Use: Not on file  . Drug Use: Not on file  . Sexual Activity: Not on file   Other Topics Concern  . Not on file    PHYSICAL EXAM   Filed Vitals:   08/07/14 1031  BP: 104/61  Pulse: 79  Height: 5' (1.524 m)  Weight: 113 lb (51.256 kg)    Not recorded      Body mass index is 22.07 kg/(m^2).  PHYSICAL EXAMNIATION:  Gen: NAD, conversant, well nourised, obese, well groomed                     Cardiovascular: Regular rate rhythm, no peripheral edema, warm, nontender. Eyes: Conjunctivae clear without exudates or hemorrhage Neck: Supple, no carotid bruise. Pulmonary: Clear to auscultation bilaterally   NEUROLOGICAL EXAM:  MENTAL STATUS: Speech:    Speech is normal; fluent and spontaneous with normal comprehension.  Cognition:   Mini-Mental Status Examination is 22 out of 30 today, she is not oriented to date, has difficulties spell world backwards, missed 3 out of 3 recall, difficulty copy design  CRANIAL NERVES: CN II: Visual fields are full to confrontation. Fundoscopic exam is normal with sharp discs and no vascular changes. Venous pulsations are present bilaterally. Pupils are 4 mm and briskly reactive to light. Visual acuity is 20/20 bilaterally. CN III, IV, VI: extraocular movement are normal. No ptosis. CN V: Facial sensation is intact to pinprick in all 3 divisions bilaterally. Corneal  responses are intact.  CN VII: Face is symmetric with normal eye closure and smile. CN VIII: Hearing is normal to rubbing fingers CN IX, X: Palate elevates symmetrically. Phonation is normal. CN XI: Head turning and shoulder shrug are intact CN XII: Tongue is midline with normal movements and no atrophy.  MOTOR: She has mild limb and nuchal rigidity, fatigability on rapid wrist opening and closure, right more than left, she has mild bilateral ankle dorsiflexion weakness, right worse than left,  REFLEXES: Reflexes are 1 and symmetric at the biceps, triceps, knees, and ankles. Plantar responses are flexor.  SENSORY: Length dependent decreased Light touch, pinprick, position sense, and vibration sense  at toes   COORDINATION: Rapid alternating movements and fine finger movements are intact. There is no dysmetria on finger-to-nose and heel-knee-shin. There are no abnormal or extraneous movements.   GAIT/STANCE:  She need assistant to get up from seated position, cautious, difficulty initiate gait, bilateral foot drop, right worse than left  DIAGNOSTIC DATA (LABS, IMAGING, TESTING) - I reviewed patient records, labs, notes, testing and imaging myself where available.   ASSESSMENT AND PLAN  Eliseo GumJoanna Sandhu is a 81 y.o. female  with long-standing history of Parkinson's disease, progressive worsening gait difficulty, worsening memory trouble, Mini-Mental Status Examination is 22 out of 30 today, with sundowning phenomenon, visual hallucinations, difficulty sleeping,  1, Parkinson's disease, dementia, keep current dose of Sinemet 25/100 mg 3 times a day, change timing to 8, 12, 1700. 3. Seroquel 25 mg  one tablets every night, stop abilify 4. Start Namenda 10 mg twice a day 5, her gait difficulty are likely combination of deconditioning, parkinsonian features, there was also evidence of mild right more than left ankle dorsiflexion weakness, likely a component of lumbar radiculopathies, she  complains of chronic low back pain, she does not want any evaluation at this point, 6, return to clinic in 3 months  No orders of the defined types were placed in this encounter.    New Prescriptions   MEMANTINE (NAMENDA) 10 MG TABLET    Take 1 tablet (10 mg total) by mouth 2 (two) times daily.    Medications Discontinued During This Encounter  Medication Reason  . ARIPiprazole (ABILIFY) 2 MG tablet   . QUEtiapine (SEROQUEL) 25 MG tablet Reorder    Return in about 3 months (around 11/07/2014).   Levert FeinsteinYijun Vianka Ertel, M.D. Ph.D.  [facility] [address], [address] [place], [place] [address] Ph: [phone]) [phone] Fax: [phone]

## 2014-09-08 ENCOUNTER — Ambulatory Visit: Payer: Medicare Other | Admitting: Podiatry

## 2014-09-16 ENCOUNTER — Ambulatory Visit: Payer: Medicare Other | Admitting: Podiatry

## 2014-09-24 ENCOUNTER — Ambulatory Visit (INDEPENDENT_AMBULATORY_CARE_PROVIDER_SITE_OTHER): Payer: Medicare Other | Admitting: Podiatry

## 2014-09-24 ENCOUNTER — Encounter: Payer: Self-pay | Admitting: Podiatry

## 2014-09-24 DIAGNOSIS — M79676 Pain in unspecified toe(s): Secondary | ICD-10-CM | POA: Diagnosis not present

## 2014-09-24 DIAGNOSIS — B351 Tinea unguium: Secondary | ICD-10-CM | POA: Diagnosis not present

## 2014-09-24 NOTE — Progress Notes (Signed)
Patient ID: Penny GumJoanna Robbins, female   DOB: December 05, 1924, 81 y.o.   MRN: [id number]  Subjective: This patient presents again complaining that her toenails are uncomfortable when she wears shoes and requesting nail debridement  Objective: Patient needs assistance to transfer from wheelchair to treatment chair The toenails are elongated hypertrophic, incurvated, discolored and tender to direct palpation 6-10  Assessment: Symptomatic onychomycoses 6-10  Plan: Debridement toenails 10 without any bleeding  Reappoint 3 months

## 2014-11-11 ENCOUNTER — Encounter (HOSPITAL_COMMUNITY): Payer: Self-pay | Admitting: Emergency Medicine

## 2014-11-11 ENCOUNTER — Emergency Department (HOSPITAL_COMMUNITY)
Admission: EM | Admit: 2014-11-11 | Discharge: 2014-11-11 | Disposition: A | Discharge status: Home / Self Care | Payer: Medicare Other | Attending: Emergency Medicine | Admitting: Emergency Medicine

## 2014-11-11 DIAGNOSIS — F0390 Unspecified dementia without behavioral disturbance: Secondary | ICD-10-CM | POA: Diagnosis not present

## 2014-11-11 DIAGNOSIS — G629 Polyneuropathy, unspecified: Secondary | ICD-10-CM | POA: Diagnosis not present

## 2014-11-11 DIAGNOSIS — Z792 Long term (current) use of antibiotics: Secondary | ICD-10-CM | POA: Insufficient documentation

## 2014-11-11 DIAGNOSIS — G20 Parkinson's disease: Secondary | ICD-10-CM | POA: Diagnosis not present

## 2014-11-11 DIAGNOSIS — M810 Age-related osteoporosis without current pathological fracture: Secondary | ICD-10-CM | POA: Diagnosis not present

## 2014-11-11 DIAGNOSIS — G894 Chronic pain syndrome: Secondary | ICD-10-CM | POA: Diagnosis not present

## 2014-11-11 DIAGNOSIS — N390 Urinary tract infection, site not specified: Secondary | ICD-10-CM | POA: Insufficient documentation

## 2014-11-11 DIAGNOSIS — Z7982 Long term (current) use of aspirin: Secondary | ICD-10-CM | POA: Diagnosis not present

## 2014-11-11 DIAGNOSIS — Z79899 Other long term (current) drug therapy: Secondary | ICD-10-CM | POA: Insufficient documentation

## 2014-11-11 DIAGNOSIS — Z8673 Personal history of transient ischemic attack (TIA), and cerebral infarction without residual deficits: Secondary | ICD-10-CM | POA: Diagnosis not present

## 2014-11-11 DIAGNOSIS — R531 Weakness: Secondary | ICD-10-CM | POA: Insufficient documentation

## 2014-11-11 DIAGNOSIS — Z8719 Personal history of other diseases of the digestive system: Secondary | ICD-10-CM | POA: Insufficient documentation

## 2014-11-11 DIAGNOSIS — I10 Essential (primary) hypertension: Secondary | ICD-10-CM | POA: Diagnosis not present

## 2014-11-11 DIAGNOSIS — R300 Dysuria: Secondary | ICD-10-CM | POA: Diagnosis present

## 2014-11-11 LAB — URINE MICROSCOPIC-ADD ON

## 2014-11-11 LAB — CBC WITH DIFFERENTIAL/PLATELET
BASOS ABS: 0 10*3/uL (ref 0.0–0.1)
BASOS PCT: 0 % (ref 0–1)
EOS PCT: 2 % (ref 0–5)
Eosinophils Absolute: 0.1 10*3/uL (ref 0.0–0.7)
HCT: 33.7 % — ABNORMAL LOW (ref 36.0–46.0)
Hemoglobin: 11.4 g/dL — ABNORMAL LOW (ref 12.0–15.0)
Lymphocytes Relative: 31 % (ref 12–46)
Lymphs Abs: 1.3 10*3/uL (ref 0.7–4.0)
MCH: 30.7 pg (ref 26.0–34.0)
MCHC: 33.8 g/dL (ref 30.0–36.0)
MCV: 90.8 fL (ref 78.0–100.0)
Monocytes Absolute: 0.6 10*3/uL (ref 0.1–1.0)
Monocytes Relative: 14 % — ABNORMAL HIGH (ref 3–12)
NEUTROS PCT: 53 % (ref 43–77)
Neutro Abs: 2.2 10*3/uL (ref 1.7–7.7)
PLATELETS: 194 10*3/uL (ref 150–400)
RBC: 3.71 MIL/uL — ABNORMAL LOW (ref 3.87–5.11)
RDW: 13.4 % (ref 11.5–15.5)
WBC: 4.1 10*3/uL (ref 4.0–10.5)

## 2014-11-11 LAB — I-STAT CHEM 8, ED
BUN: 27 mg/dL — ABNORMAL HIGH (ref 6–20)
CALCIUM ION: 1.19 mmol/L (ref 1.13–1.30)
Chloride: 104 mmol/L (ref 101–111)
Creatinine, Ser: 0.8 mg/dL (ref 0.44–1.00)
Glucose, Bld: 87 mg/dL (ref 65–99)
HEMATOCRIT: 34 % — AB (ref 36.0–46.0)
Hemoglobin: 11.6 g/dL — ABNORMAL LOW (ref 12.0–15.0)
Potassium: 4 mmol/L (ref 3.5–5.1)
SODIUM: 141 mmol/L (ref 135–145)
TCO2: 24 mmol/L (ref 0–100)

## 2014-11-11 LAB — URINALYSIS, ROUTINE W REFLEX MICROSCOPIC
Bilirubin Urine: NEGATIVE
Glucose, UA: NEGATIVE mg/dL
HGB URINE DIPSTICK: NEGATIVE
Ketones, ur: NEGATIVE mg/dL
NITRITE: NEGATIVE
PROTEIN: NEGATIVE mg/dL
Specific Gravity, Urine: 1.015 (ref 1.005–1.030)
UROBILINOGEN UA: 1 mg/dL (ref 0.0–1.0)
pH: 7.5 (ref 5.0–8.0)

## 2014-11-11 LAB — TSH: TSH: 1.714 u[IU]/mL (ref 0.350–4.500)

## 2014-11-11 MED ORDER — DEXTROSE 5 % IV SOLN
1.0000 g | Freq: Once | INTRAVENOUS | Status: AC
  Administered 2014-11-11: 1 g via INTRAVENOUS
  Filled 2014-11-11: qty 10

## 2014-11-11 MED ORDER — SODIUM CHLORIDE 0.9 % IV BOLUS (SEPSIS)
500.0000 mL | Freq: Once | INTRAVENOUS | Status: AC
  Administered 2014-11-11: 500 mL via INTRAVENOUS

## 2014-11-11 MED ORDER — CEPHALEXIN 250 MG PO CAPS: 250.0000 mg | ORAL_CAPSULE | Freq: Four times a day (QID) | ORAL | Status: AC

## 2014-11-11 MED ORDER — CARVEDILOL 12.5 MG PO TABS
12.5000 mg | ORAL_TABLET | Freq: Two times a day (BID) | ORAL | Status: DC
  Administered 2014-11-11: 12.5 mg via ORAL
  Filled 2014-11-11 (×2): qty 1

## 2014-11-11 NOTE — ED Notes (Signed)
Awake. Verbally responsive. A/O x4. Resp even and unlabored. No audible adventitious breath sounds noted. ABC's intact. SR on monitor. IV saline lock patent and intact. Dr. Anitra Lauth at bedside.

## 2014-11-11 NOTE — ED Notes (Addendum)
Awake. Verbally responsive. A/O x4. Resp even and unlabored. No audible adventitious breath sounds noted. ABC's intact. SR on monitor. IV saline lock patent and intact. Family at bedside. Lab called and requested blood and urine results.

## 2014-11-11 NOTE — ED Notes (Signed)
Awake. Verbally responsive. A/O x4. Resp even and unlabored. No audible adventitious breath sounds noted. ABC's intact. Family at bedside. 

## 2014-11-11 NOTE — ED Provider Notes (Addendum)
CSN: [id number]     Arrival date & time 11/11/14  1191 History   First MD Initiated Contact with Patient 11/11/14 1020     Chief Complaint  Patient presents with  . Weakness     (Consider location/radiation/quality/duration/timing/severity/associated sxs/prior Treatment) HPI Comments: Patient is an 81 year old female with a history of Parkinson's disease, chronic pain syndrome, hypertension, dementia who presents [facility] from [facility] which is a nursing facility with complaint of fatigue and not acting herself. Patient does admit to some dysuria for an unknown amount of time with normal bowel movements no abdominal pain, nausea or vomiting. She also admits to decreased appetite for the last few days and has eaten today but very little. She denies any recent medication changes but does take multiple pain medications for her chronic pain syndrome.  She denies any localized areas of tenderness and no recent fevers, cough or shortness of breath. She states that she will occasionally have chest pain but she has not had any yesterday or today. She speaks with her eyes closed but she is able to answer all questions without difficulty and appropriately.  The history is provided by the patient.    Past Medical History  Diagnosis Date  . Hypertension   . Stroke   . Osteoporosis   . IBS (irritable bowel syndrome)   . Parkinson disease   . Peripheral neuropathy   . Chronic pain syndrome   . Spinal stenosis   . Dementia    Past Surgical History  Procedure Laterality Date  . Abdominal hysterectomy    . Tubal ligation    . Tonsillectomy     Family History  Problem Relation Age of Onset  . Healthy Mother   . Tuberculosis Father    Social History  Substance Use Topics  . Smoking status: Never Smoker   . Smokeless tobacco: None  . Alcohol Use: No   OB History    No data available     Review of Systems  All other systems reviewed and are negative.     Allergies  Benicar;  Paxil; and Vasotec  Home Medications   Prior to Admission medications   Medication Sig Start Date End Date Taking? Authorizing Provider  carbidopa-levodopa (SINEMET IR) 25-100 MG per tablet Take 1 tablet by mouth 3 (three) times daily.    Yes Historical Provider, MD  clindamycin (CLEOCIN T) 1 % lotion Apply 1 application topically 2 (two) times daily. Self administers   Yes Historical Provider, MD  gabapentin (NEURONTIN) 100 MG capsule Take 200 mg by mouth at bedtime.    Yes Historical Provider, MD  oxymorphone (OPANA ER) 5 MG 12 hr tablet Take 5 mg by mouth every 12 (twelve) hours.   Yes Historical Provider, MD  QUEtiapine (SEROQUEL) 25 MG tablet one tab po qhs Patient taking differently: Take 25 mg by mouth at bedtime. one tab po qhs 08/07/14  Yes Levert Feinstein, MD  amLODipine (NORVASC) 5 MG tablet Take 5 mg by mouth 2 (two) times daily.     Historical Provider, MD  aspirin 81 MG chewable tablet Chew 81 mg by mouth daily after breakfast.    Historical Provider, MD  bisacodyl (DULCOLAX) 10 MG suppository Place 10 mg rectally daily as needed for moderate constipation.    Historical Provider, MD  carvedilol (COREG) 12.5 MG tablet Take 12.5 mg by mouth 2 (two) times daily with a meal.     Historical Provider, MD  Cholecalciferol (VITAMIN D3 SUPER STRENGTH PO) Take 2,000 mg  by mouth daily after breakfast.    Historical Provider, MD  Cyanocobalamin (VITAMIN B-12) 1000 MCG SUBL Place 1,000 mcg under the tongue daily.    Historical Provider, MD  docusate sodium (COLACE) 100 MG capsule Take 100 mg by mouth 2 (two) times daily.    Historical Provider, MD  famotidine (PEPCID) 20 MG tablet Take 20 mg by mouth 2 (two) times daily.    Historical Provider, MD  glycerin adult 2 G SUPP Place 1 suppository rectally as needed for moderate constipation.    Historical Provider, MD  HYDROcodone-acetaminophen (NORCO/VICODIN) 5-325 MG per tablet Take 1 tablet by mouth every 6 (six) hours as needed for moderate pain.  07/15/14   Christopher Lawyer, PA-C  LORazepam (ATIVAN) 0.5 MG tablet Take 0.5 mg by mouth every 8 (eight) hours as needed.     Historical Provider, MD  meclizine (ANTIVERT) 12.5 MG tablet Take 12.5 mg by mouth every 8 (eight) hours as needed for dizziness.    Historical Provider, MD  memantine (NAMENDA) 10 MG tablet Take 1 tablet (10 mg total) by mouth 2 (two) times daily. 08/07/14   Levert Feinstein, MD  Multiple Vitamins-Minerals (I-VITE PO) Take 1 tablet by mouth daily after breakfast.     Historical Provider, MD  polyethylene glycol powder (GLYCOLAX/MIRALAX) powder Take 17 g by mouth 2 (two) times daily as needed for mild constipation.    Historical Provider, MD  potassium chloride (MICRO-K) 10 MEQ CR capsule Take 10-20 mEq by mouth daily after breakfast. Takes 10 mEq Sunday, Tuesday, Thursday, and Saturday  Takes 20 mEq Monday, Wednesday, and Friday    Historical Provider, MD  psyllium (METAMUCIL) 58.6 % powder Take 1 packet by mouth daily.    Historical Provider, MD   BP 163/95 mmHg  Pulse 70  Temp(Src) 98.5 F (36.9 C) (Oral)  Resp 18  Ht  (1.575 m)  Wt 110 lb (49.896 kg)  BMI 20.11 kg/m2  SpO2 96% Physical Exam  Constitutional: She is oriented to person, place, and time. She appears well-developed and well-nourished. No distress.  Smells strongly of urine when walking in the room  HENT:  Head: Normocephalic and atraumatic.  Mouth/Throat: Oropharynx is clear and moist. Mucous membranes are dry.  Eyes: Conjunctivae and EOM are normal. Pupils are equal, round, and reactive to light.  Neck: Normal range of motion. Neck supple.  Cardiovascular: Normal rate, regular rhythm and intact distal pulses.   No murmur heard. Pulmonary/Chest: Effort normal and breath sounds normal. No respiratory distress. She has no wheezes. She has no rales.  Abdominal: Soft. She exhibits no distension. There is no tenderness. There is no rebound, no guarding and no CVA tenderness.  Musculoskeletal: Normal  range of motion. She exhibits tenderness. She exhibits no edema.  Mild tenderness with palpation of bilateral lower extremities without any significant erythema or swelling. Skin changes consistent with chronic venous stasis  Neurological: She is alert and oriented to person, place, and time.  Skin: Skin is warm and dry. No rash noted. No erythema.  Psychiatric: She has a normal mood and affect. Her behavior is normal.  Nursing note and vitals reviewed.   ED Course  Procedures (including critical care time) Labs Review Labs Reviewed  I-STAT CHEM 8, ED - Abnormal; Notable for the following:    BUN 27 (*)    Hemoglobin 11.6 (*)    HCT 34.0 (*)    All other components within normal limits  URINE CULTURE  CBC WITH DIFFERENTIAL/PLATELET  URINALYSIS, ROUTINE  W REFLEX MICROSCOPIC (NOT AT [facility])  TSH    Imaging Review No results found. I, Trimaine Maser, personally reviewed and evaluated these images and lab results as part of my medical decision-making.   EKG Interpretation   Date/Time:  Tuesday November 11 2014 10:57:09 EDT Ventricular Rate:  70 PR Interval:  205 QRS Duration: 98 QT Interval:  408 QTC Calculation: 440 R Axis:   10 Text Interpretation:  Sinus rhythm Ventricular premature complex Probable  left atrial enlargement Low voltage, precordial leads No previous tracing  Confirmed by Anitra Lauth  MD, Alphonzo Lemmings ([id number]) on 11/11/2014 11:24:44 AM      MDM   Final diagnoses:  UTI (lower urinary tract infection)    Patient is a 81 year old lady who is coming from a nursing facility with generalized fatigue for an unknown known amount of time. She is also complaining of dysuria. She states she gets intermittent chest pain but denies having any chest pain today and no cough or vomiting. Patient is alert and awake but just lays with her eyes closed. She is able to answer all my questions without difficulty. She denies any recent medication changes but states she has had burning  with urination for some time but she cannot specify the length of time.  She notes decreased appetite but also smells strongly of urine when walking into the room. She has no abdominal tenderness on exam and vital signs are relatively unremarkable except for mild hypertension of 163/95. Patient states she just feels tired. This can be coming from a possible urinary tract infection versus medication related as she does have chronic pain syndrome and is on Opana as well as several other medications that can induce drowsiness. Low suspicion for cardiac cause for her fatigue. Also possible thyroid disease.  UA, CBC, urine culture, chem 8, TSH, EKG pending.  2:48 PM Labs are within normal limits except for UA with many bacteria and small leukocytes.  Patient is hypertensive here however unclear if she actually received her blood pressure medications prior to coming today. Spoke with her family member and he states she's had intermittent days of being very tired to wear a he had to almost force feed her last night which is not usual for her. He denies any medication changes. It could be that this is an early urinary tract infection but does not appear to be moving all disease, electrolyte abnormality. Patient has no evidence of a leukocytosis or fever here. Will start treating a urinary tract infection and discharge her back home. Gave her son strict instructions if things worsen to send her back to emergency room for further evaluation and care  Gwyneth Sprout, MD 11/11/14 1450  Gwyneth Sprout, MD 11/11/14 [phone]

## 2014-11-11 NOTE — ED Notes (Signed)
Awake. Verbally responsive. A/O x4. Resp even and unlabored. No audible adventitious breath sounds noted. ABC's intact. SR on monitor. Currently waiting on [facility] transport.

## 2014-11-11 NOTE — ED Notes (Signed)
Resting quietly with eye closed. Easily arousable. Verbally responsive. Resp even and unlabored. ABC's intact. Family at bedside. Urine noted with foul odor.

## 2014-11-11 NOTE — ED Notes (Signed)
Bed: FA21 Expected date:  Expected time:  Means of arrival:  Comments: EMS- elderly, lethargic

## 2014-11-11 NOTE — ED Notes (Signed)
Awake. Verbally responsive. A/O x4. Resp even and unlabored. No audible adventitious breath sounds noted. ABC's intact. SR on monitor. Pt given [place] sandwich and drink.  Communication called for transport back to facility.

## 2014-11-11 NOTE — ED Notes (Signed)
Awake. Verbally responsive. A/O x4. Resp even and unlabored. No audible adventitious breath sounds noted. ABC's intact.  

## 2014-11-11 NOTE — ED Notes (Signed)
Awake. Verbally responsive. A/O x4. Resp even and unlabored. No audible adventitious breath sounds noted. ABC's intact. Family at bedside. Waiting on [facility] for transport.

## 2014-11-11 NOTE — ED Notes (Signed)
Awake. Verbally responsive. A/O x4. Resp even and unlabored. No audible adventitious breath sounds noted. ABC's intact. SR on monitor. IV saline lock. Family at bedside. Lab called and requested urine results.

## 2014-11-11 NOTE — ED Notes (Addendum)
Pt arrived via EMS from [facility] with report of pt lethargic. Pt awake. Verbally responsive. Pt reported feeling fatigue "for a long time", lightheaded/dizzineess, denies nausea, headaches, chest pain or visual disturbances. Pt reported lower back pain, foul odor urine, burning with void, urinary frequency/urgency, denies dysuria.

## 2014-11-13 ENCOUNTER — Ambulatory Visit: Payer: Medicare Other | Admitting: Neurology

## 2014-11-13 LAB — URINE CULTURE

## 2014-11-26 ENCOUNTER — Encounter (HOSPITAL_COMMUNITY): Payer: Self-pay

## 2014-11-26 ENCOUNTER — Emergency Department (HOSPITAL_COMMUNITY)
Admission: EM | Admit: 2014-11-26 | Discharge: 2014-11-26 | Disposition: A | Discharge status: Home / Self Care | Payer: Medicare Other | Attending: Emergency Medicine | Admitting: Emergency Medicine

## 2014-11-26 ENCOUNTER — Emergency Department (HOSPITAL_COMMUNITY): Payer: Medicare Other

## 2014-11-26 DIAGNOSIS — G894 Chronic pain syndrome: Secondary | ICD-10-CM | POA: Insufficient documentation

## 2014-11-26 DIAGNOSIS — R4182 Altered mental status, unspecified: Secondary | ICD-10-CM | POA: Diagnosis present

## 2014-11-26 DIAGNOSIS — Z79899 Other long term (current) drug therapy: Secondary | ICD-10-CM | POA: Diagnosis not present

## 2014-11-26 DIAGNOSIS — Z8739 Personal history of other diseases of the musculoskeletal system and connective tissue: Secondary | ICD-10-CM | POA: Insufficient documentation

## 2014-11-26 DIAGNOSIS — I10 Essential (primary) hypertension: Secondary | ICD-10-CM | POA: Diagnosis not present

## 2014-11-26 DIAGNOSIS — Z8673 Personal history of transient ischemic attack (TIA), and cerebral infarction without residual deficits: Secondary | ICD-10-CM | POA: Insufficient documentation

## 2014-11-26 DIAGNOSIS — Z8719 Personal history of other diseases of the digestive system: Secondary | ICD-10-CM | POA: Diagnosis not present

## 2014-11-26 DIAGNOSIS — Z7982 Long term (current) use of aspirin: Secondary | ICD-10-CM | POA: Insufficient documentation

## 2014-11-26 DIAGNOSIS — G629 Polyneuropathy, unspecified: Secondary | ICD-10-CM | POA: Diagnosis not present

## 2014-11-26 DIAGNOSIS — G20 Parkinson's disease: Secondary | ICD-10-CM | POA: Insufficient documentation

## 2014-11-26 LAB — COMPREHENSIVE METABOLIC PANEL
ALT: 8 U/L — ABNORMAL LOW (ref 14–54)
ANION GAP: 7 (ref 5–15)
AST: 14 U/L — ABNORMAL LOW (ref 15–41)
Albumin: 3.4 g/dL — ABNORMAL LOW (ref 3.5–5.0)
Alkaline Phosphatase: 75 U/L (ref 38–126)
BUN: 18 mg/dL (ref 6–20)
CHLORIDE: 109 mmol/L (ref 101–111)
CO2: 25 mmol/L (ref 22–32)
Calcium: 8.9 mg/dL (ref 8.9–10.3)
Creatinine, Ser: 0.78 mg/dL (ref 0.44–1.00)
GFR calc non Af Amer: >60 mL/min (ref >60)
Glucose, Bld: 87 mg/dL (ref 65–99)
POTASSIUM: 3.8 mmol/L (ref 3.5–5.1)
SODIUM: 141 mmol/L (ref 135–145)
Total Bilirubin: 0.7 mg/dL (ref 0.3–1.2)
Total Protein: 5.5 g/dL — ABNORMAL LOW (ref 6.5–8.1)

## 2014-11-26 LAB — CBC
HEMATOCRIT: 32.4 % — AB (ref 36.0–46.0)
HEMOGLOBIN: 10.7 g/dL — AB (ref 12.0–15.0)
MCH: 29.6 pg (ref 26.0–34.0)
MCHC: 33 g/dL (ref 30.0–36.0)
MCV: 89.8 fL (ref 78.0–100.0)
Platelets: 188 10*3/uL (ref 150–400)
RBC: 3.61 MIL/uL — AB (ref 3.87–5.11)
RDW: 13.4 % (ref 11.5–15.5)
WBC: 4.2 10*3/uL (ref 4.0–10.5)

## 2014-11-26 LAB — URINALYSIS, ROUTINE W REFLEX MICROSCOPIC
Bilirubin Urine: NEGATIVE
Glucose, UA: NEGATIVE mg/dL
Hgb urine dipstick: NEGATIVE
KETONES UR: NEGATIVE mg/dL
LEUKOCYTES UA: NEGATIVE
NITRITE: NEGATIVE
PH: 7 (ref 5.0–8.0)
Protein, ur: NEGATIVE mg/dL
SPECIFIC GRAVITY, URINE: 1.011 (ref 1.005–1.030)
Urobilinogen, UA: 0.2 mg/dL (ref 0.0–1.0)

## 2014-11-26 LAB — I-STAT TROPONIN, ED: Troponin i, poc: 0.01 ng/mL (ref 0.00–0.08)

## 2014-11-26 LAB — CBG MONITORING, ED: GLUCOSE-CAPILLARY: 87 mg/dL (ref 65–99)

## 2014-11-26 NOTE — ED Provider Notes (Signed)
CSN: [id number]     Arrival date & time 11/26/14  1053 History   First MD Initiated Contact with Patient 11/26/14 1102     Chief Complaint  Patient presents with  . Altered Mental Status     (Consider location/radiation/quality/duration/timing/severity/associated sxs/prior Treatment) HPI Comments: No alleviating or exacerbating factors for her unresponsiveness. Woke up and was acting normal prior to this.  Patient is a 81 y.o. female presenting with altered mental status. The history is provided by the patient.  Altered Mental Status Presenting symptoms: partial responsiveness (had a brief episode where she didn't respond to caregivers other than moan. Normally is talkative)   Severity:  Mild Most recent episode:  Today Episode history:  Single Duration:  3 minutes Timing:  Constant Progression:  Unchanged Chronicity:  New Context: nursing home resident   Context: not dementia   Associated symptoms: no depression, no fever, no nausea and no vomiting     Past Medical History  Diagnosis Date  . Hypertension   . Stroke   . Osteoporosis   . IBS (irritable bowel syndrome)   . Parkinson disease   . Peripheral neuropathy   . Chronic pain syndrome   . Spinal stenosis   . Dementia    Past Surgical History  Procedure Laterality Date  . Abdominal hysterectomy    . Tubal ligation    . Tonsillectomy     Family History  Problem Relation Age of Onset  . Healthy Mother   . Tuberculosis Father    Social History  Substance Use Topics  . Smoking status: Never Smoker   . Smokeless tobacco: None  . Alcohol Use: No   OB History    No data available     Review of Systems  Constitutional: Negative for fever.  Respiratory: Negative for cough and shortness of breath.   Cardiovascular: Negative for chest pain and leg swelling.  Gastrointestinal: Negative for nausea and vomiting.  All other systems reviewed and are negative.     Allergies  Benicar; Paxil; and  Vasotec  Home Medications   Prior to Admission medications   Medication Sig Start Date End Date Taking? Authorizing Provider  aspirin 81 MG chewable tablet Chew 81 mg by mouth daily after breakfast.    Historical Provider, MD  bisacodyl (DULCOLAX) 10 MG suppository Place 10 mg rectally daily as needed for moderate constipation.    Historical Provider, MD  carbidopa-levodopa (SINEMET IR) 25-100 MG per tablet Take 1 tablet by mouth 3 (three) times daily.     Historical Provider, MD  carvedilol (COREG) 12.5 MG tablet Take 12.5 mg by mouth 2 (two) times daily with a meal.     Historical Provider, MD  cephALEXin (KEFLEX) 250 MG capsule Take 1 capsule (250 mg total) by mouth 4 (four) times daily. 11/11/14   Gwyneth Sprout, MD  Cholecalciferol (VITAMIN D3 SUPER STRENGTH PO) Take 2,000 mg by mouth daily after breakfast.    Historical Provider, MD  clindamycin (CLEOCIN T) 1 % lotion Apply 1 application topically 2 (two) times daily. Self administers    Historical Provider, MD  Cyanocobalamin (VITAMIN B-12) 1000 MCG SUBL Place 1,000 mcg under the tongue daily.    Historical Provider, MD  docusate sodium (COLACE) 100 MG capsule Take 100 mg by mouth 2 (two) times daily.    Historical Provider, MD  famotidine (PEPCID) 20 MG tablet Take 20 mg by mouth 2 (two) times daily.    Historical Provider, MD  gabapentin (NEURONTIN) 100 MG capsule Take 200  mg by mouth at bedtime.     Historical Provider, MD  glycerin adult 2 G SUPP Place 1 suppository rectally as needed for moderate constipation.    Historical Provider, MD  HYDROcodone-acetaminophen (NORCO/VICODIN) 5-325 MG per tablet Take 1 tablet by mouth every 6 (six) hours as needed for moderate pain. Patient not taking: Reported on 11/11/2014 07/15/14   Charlestine Night, PA-C  memantine (NAMENDA) 10 MG tablet Take 1 tablet (10 mg total) by mouth 2 (two) times daily. 08/07/14   Levert Feinstein, MD  Multiple Vitamins-Minerals (I-VITE PO) Take 1 tablet by mouth daily after  breakfast.     Historical Provider, MD  mupirocin ointment (BACTROBAN) 2 % Apply 1 application topically daily. 08/19/14   Historical Provider, MD  oxymorphone (OPANA ER) 5 MG 12 hr tablet Take 5 mg by mouth every 12 (twelve) hours.    Historical Provider, MD  polyethylene glycol powder (GLYCOLAX/MIRALAX) powder Take 17 g by mouth 2 (two) times daily as needed for mild constipation.    Historical Provider, MD  psyllium (METAMUCIL) 58.6 % powder Take 1 packet by mouth daily.    Historical Provider, MD  QUEtiapine (SEROQUEL) 25 MG tablet one tab po qhs Patient taking differently: Take 25 mg by mouth at bedtime. one tab po qhs 08/07/14   Levert Feinstein, MD   BP 197/74 mmHg  Pulse 68  Temp(Src) 97.6 F (36.4 C) (Rectal)  Resp 22  Ht 5\' 2"  (1.575 m)  Wt 115 lb (52.164 kg)  BMI 21.03 kg/m2  SpO2 98% Physical Exam  Constitutional: She is oriented to person, place, and time. She appears well-developed and well-nourished. No distress.  HENT:  Head: Normocephalic and atraumatic.  Mouth/Throat: Oropharynx is clear and moist.  Eyes: EOM are normal. Pupils are equal, round, and reactive to light.  Neck: Normal range of motion. Neck supple.  Cardiovascular: Normal rate and regular rhythm.  Exam reveals no friction rub.   No murmur heard. Pulmonary/Chest: Effort normal and breath sounds normal. No respiratory distress. She has no wheezes. She has no rales.  Abdominal: Soft. She exhibits no distension. There is no tenderness. There is no rebound.  Musculoskeletal: Normal range of motion. She exhibits no edema.  Neurological: She is alert and oriented to person, place, and time. She exhibits normal muscle tone.  Skin: No rash noted. She is not diaphoretic.  Nursing note and vitals reviewed.   ED Course  Procedures (including critical care time) Labs Review Labs Reviewed  COMPREHENSIVE METABOLIC PANEL  CBC  URINALYSIS, ROUTINE W REFLEX MICROSCOPIC (NOT AT [facility])  CBG MONITORING, ED    Imaging  Review Dg Chest 1 View  11/26/2014   CLINICAL DATA:  81 year old female with altered mental status. Initial encounter.  EXAM: CHEST  1 VIEW  COMPARISON:  None.  FINDINGS: AP supine view at 1207 hrs. Multilevel thoracic and lumbar compression fractures status post previous augmentation. Low lung volumes. Calcified atherosclerosis of the aorta. Allowing for portable technique, the lungs are clear. Osteopenia.  IMPRESSION: Low lung volumes, otherwise no acute cardiopulmonary abnormality.   Electronically Signed   By: Odessa Fleming M.D.   On: 11/26/2014 12:39   Ct Head Wo Contrast  11/26/2014   CLINICAL DATA:  80 year old female with decreasing mental status. Initial encounter.  EXAM: CT HEAD WITHOUT CONTRAST  TECHNIQUE: Contiguous axial images were obtained from the base of the skull through the vertex without intravenous contrast.  COMPARISON:  [facility] and cervical spine CT 07/15/2014.  FINDINGS: Visualized  paranasal sinuses and mastoids are clear. No acute orbit or scalp soft tissue finding. No acute osseous abnormality identified.  Cerebral volume is stable and normal for age. Calcified atherosclerosis at the skull base. No ventriculomegaly. No midline shift, mass effect, or evidence of intracranial mass lesion. Stable hypodensity in the left thalamus. Stable patchy bilateral cerebral white matter hypodensity. No cortically based acute infarct identified. No acute intracranial hemorrhage identified. No suspicious intracranial vascular hyperdensity.  IMPRESSION: Stable noncontrast CT appearance of the brain. No acute intracranial abnormality.   Electronically Signed   By: Odessa Fleming M.D.   On: 11/26/2014 12:38   I have personally reviewed and evaluated these images and lab results as part of my medical decision-making.   EKG Interpretation   Date/Time:  Wednesday November 26 2014 11:08:28 EDT Ventricular Rate:  67 PR Interval:  198 QRS Duration: 96 QT Interval:  401 QTC Calculation: 423 R  Axis:   7 Text Interpretation:  Sinus rhythm Ventricular premature complex Consider  anterior infarct No significant change since last tracing Confirmed by  Gwendolyn Grant  MD, Delfin Squillace ([id number]) on 11/26/2014 11:51:05 AM      ED ECG REPORT   Date: 11/26/2014  Rate: 67  Rhythm: normal sinus rhythm  QRS Axis: normal  Intervals: normal  ST/T Wave abnormalities: normal  Conduction Disutrbances:none  Narrative Interpretation:   Old EKG Reviewed: unchanged  I have personally reviewed the EKG tracing and agree with the computerized printout as noted.  MDM   Final diagnoses:  Altered mental status    60F here with altered mental status. She has been acting abnormally this morning, as in less responsive. Her last known normal time was last night. Here she is alert, oriented, depressed since her husband died last year. She is not suicidal.  She states she has too much wrong with her that no one can figure out. She reports some dysuria. Recently completed a course of keflex for a UTI. Will check labs, EKG, CT Head. Anticipate discharge.  All labs ok. No signs of infection. I spoke with her nursing home, they stated she was unresponsive earlier and wanted her to get checked out. She did not lose consciousness. Patient here doing well, back to baseline. I feel she is stable for discharge.  Elwin Mocha, MD 11/26/14 1537

## 2014-11-26 NOTE — Discharge Instructions (Signed)

## 2014-11-26 NOTE — ED Notes (Signed)
Pt from [facility].  Facility workers report that pt has been altered and less responsive.  Pt LSN was last night.  Pt verbalizes to pain.  EMS reports that pt just completed antibiotic therapy for UTI.  Pt seen recently for same complaint per EMS.

## 2014-11-26 NOTE — ED Notes (Addendum)
Attempted to call [facility] for report- no answer.

## 2014-12-30 ENCOUNTER — Ambulatory Visit: Payer: Medicare Other | Admitting: Podiatry

## 2015-01-27 ENCOUNTER — Ambulatory Visit (INDEPENDENT_AMBULATORY_CARE_PROVIDER_SITE_OTHER): Payer: Medicare Other | Admitting: Podiatry

## 2015-01-27 ENCOUNTER — Encounter: Payer: Self-pay | Admitting: Podiatry

## 2015-01-27 DIAGNOSIS — M79676 Pain in unspecified toe(s): Secondary | ICD-10-CM

## 2015-01-27 DIAGNOSIS — B351 Tinea unguium: Secondary | ICD-10-CM

## 2015-01-27 NOTE — Progress Notes (Signed)
Patient ID: Penny Robbins Code, female   DOB: 1924/06/24, 81 y.o.   MRN: [id number]  Subjective: This patient presents today complaining of painful toenails and walking wearing shoes and requests nail debridement  Objective: Asian transfers from wheelchair to treatment chair The toenails are hypertrophic, discolored, deformed, incurvated and tender to direct palpation 6-10  Assessment: Symptomatic onychomycoses 6-10  Plan: Debridement toenails 10 mechanically and electrically without any bleeding  Reappoint 3 months

## 2015-05-26 ENCOUNTER — Ambulatory Visit: Payer: Medicare Other | Admitting: Podiatry

## 2015-05-27 DEATH — deceased

## 2017-01-22 IMAGING — CR DG SHOULDER 2+V*R*
3 series · 3 of 3 positions shown · non-contrast
Comparison: None

CLINICAL DATA: Unwitnessed fall, slipped on carpet, no loss of
consciousness

EXAM:
RIGHT SHOULDER - 2+ VIEW

[x shoulder ap right (1 of 3)]
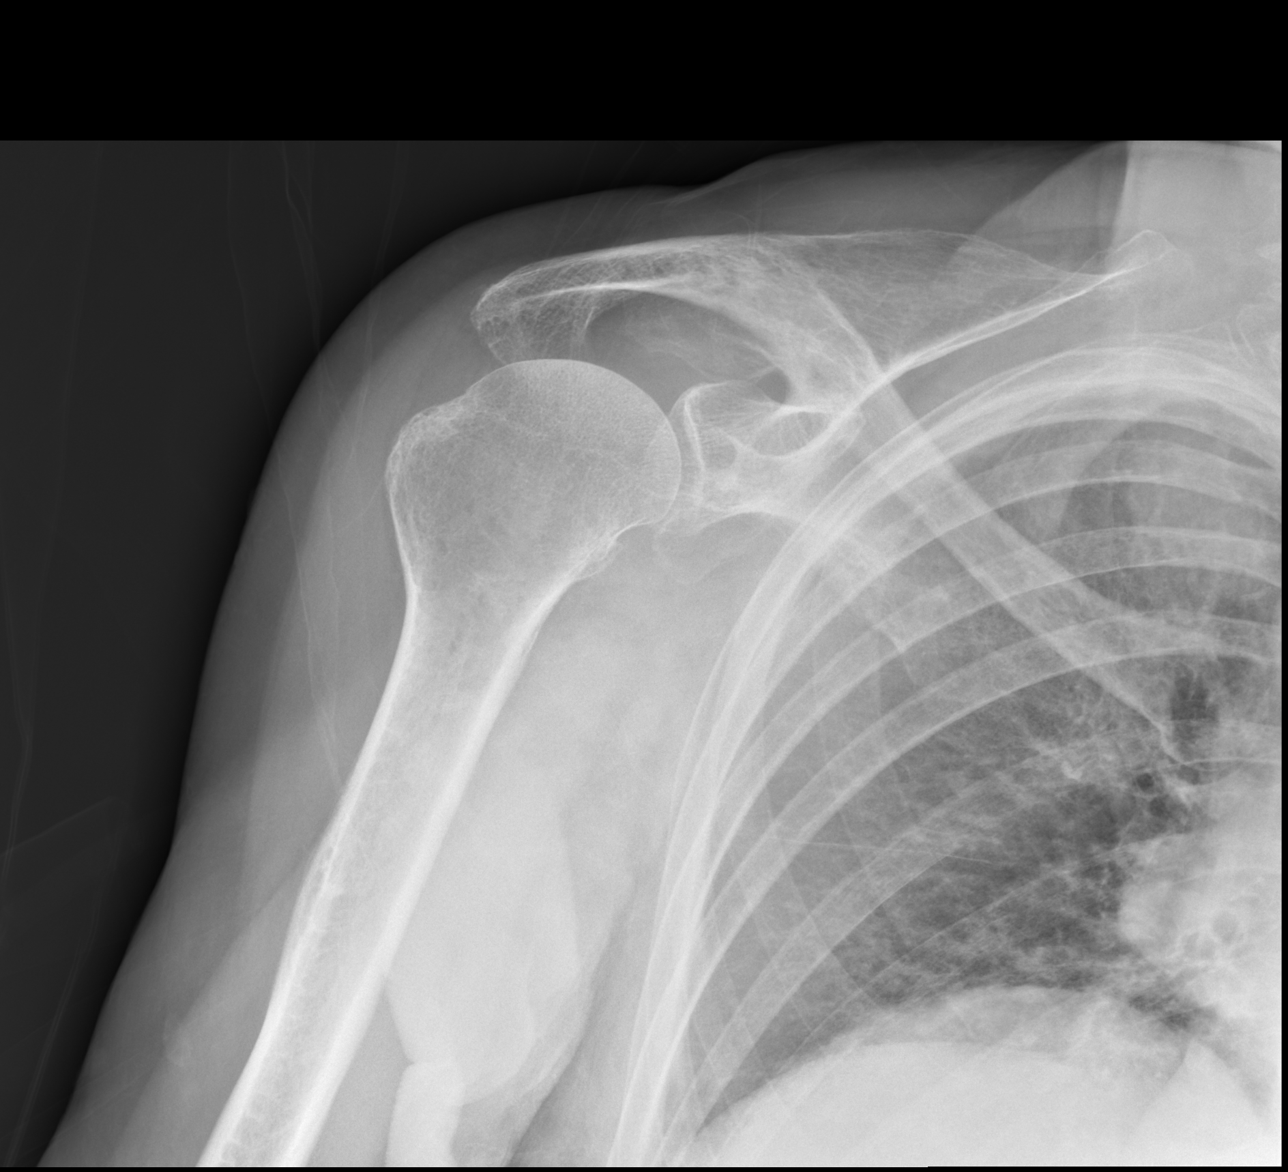

[x shoulder ap right (2 of 3)]
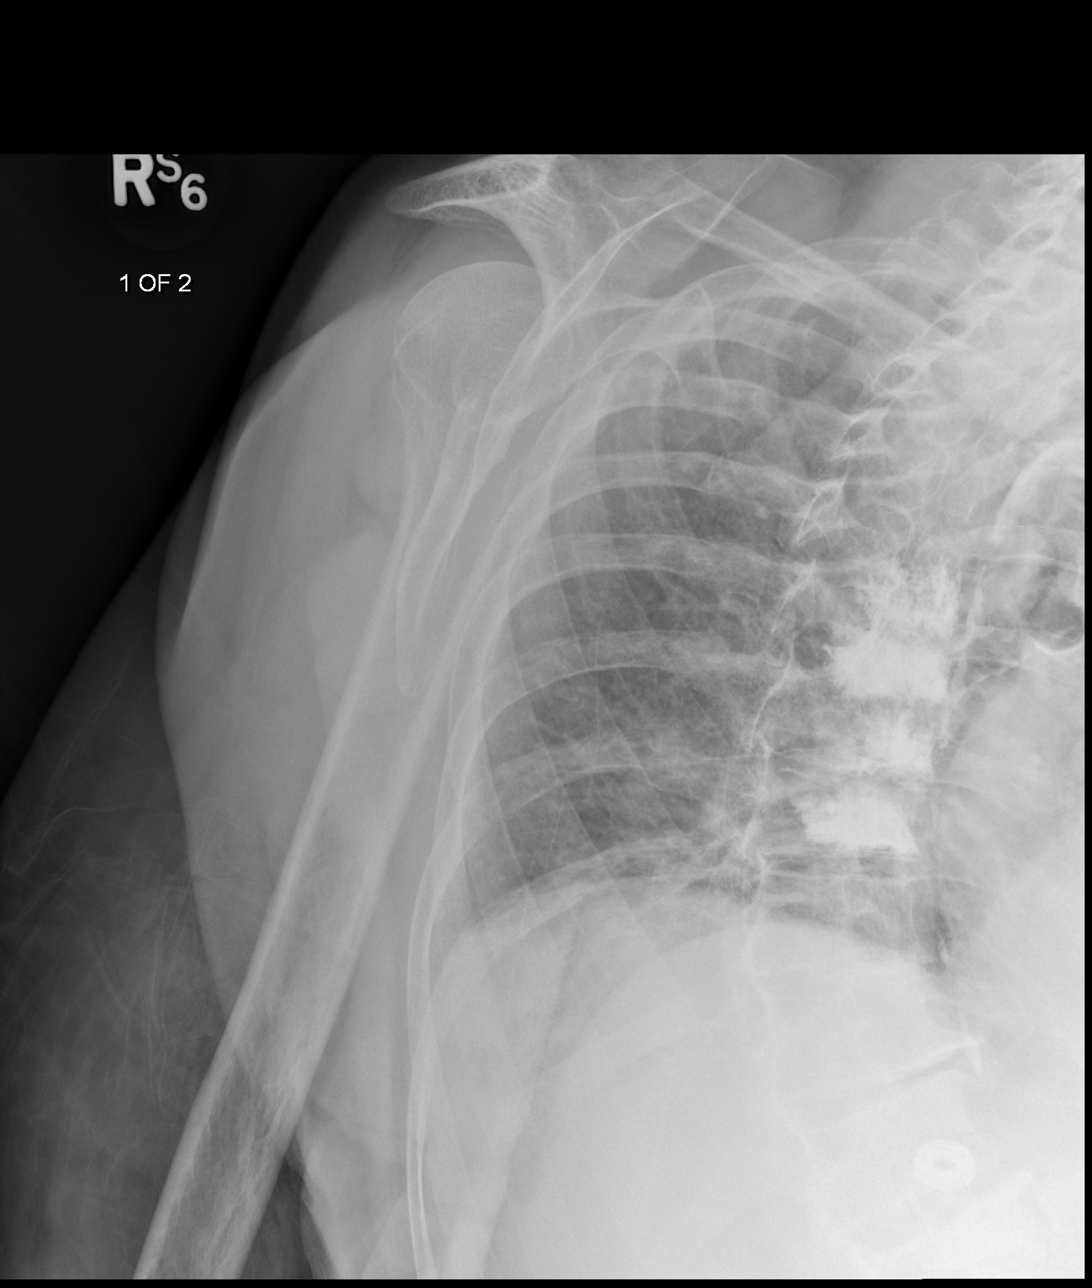

[x shoulder ap right (3 of 3)]
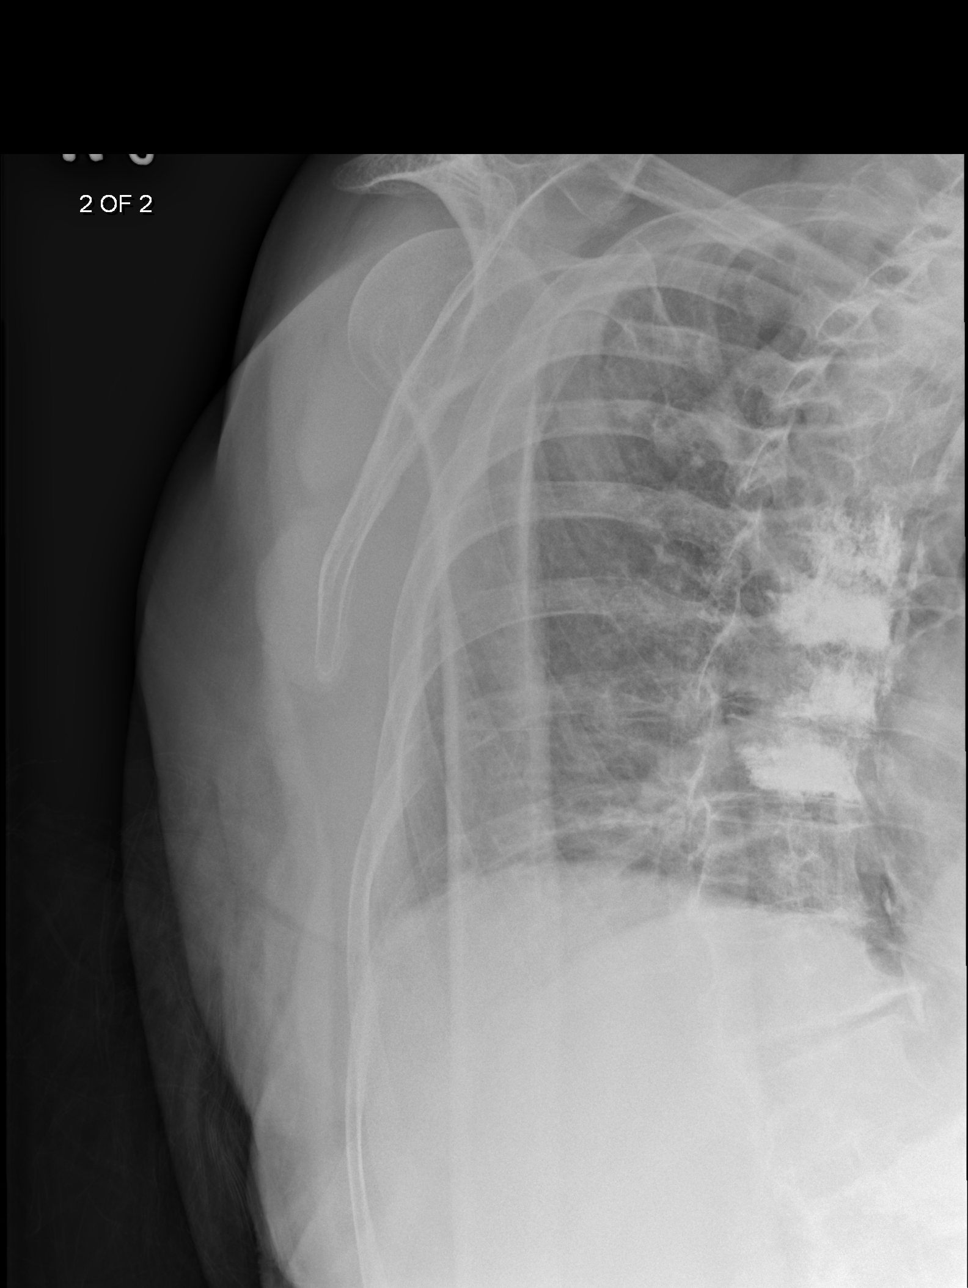

[3 of 3 positions shown; findings below may reference images not displayed]

FINDINGS: Osseous demineralization.

Question displaced distal RIGHT clavicular fracture, region poorly
visualized.

Glenohumeral alignment normal.

No glenohumeral fracture or dislocation.

Visualized RIGHT ribs intact.

Prior spinal augmentation procedures at 4 contiguous levels of the
thoracic spine.
IMPRESSION: Osseous demineralization with question minimally displaced distal
RIGHT clavicular fracture ; recommend correlation for
pain/tenderness at this site.

## 2017-06-05 IMAGING — CT CT HEAD W/O CM
2 series · 15 of 30 positions shown, 19 images · non-contrast
Comparison: [HOSPITAL] Head and cervical spine CT
07/15/2014.

CLINICAL DATA: 89-year-old female with decreasing mental status.
Initial encounter.

EXAM:
CT HEAD WITHOUT CONTRAST
TECHNIQUE: Contiguous axial images were obtained from the base of the skull
through the vertex without intravenous contrast.

[Series 201: head w/o, idose (1) · axial · non-contrast · 0.42mm/px · z∈[+41,+161]mm · 13 of 30 slices shown, 17 images]
[im 3/30  brain]
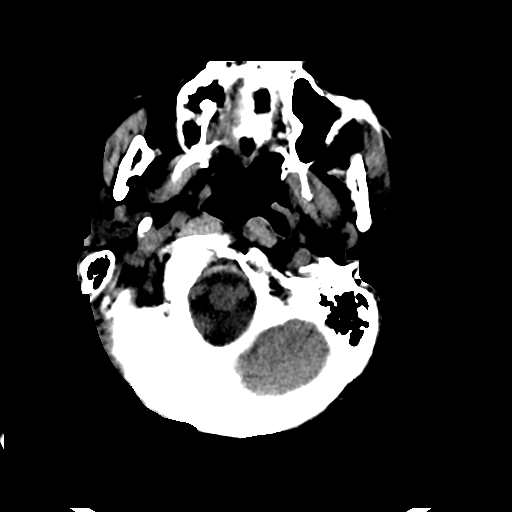
[im 3/30  bone]
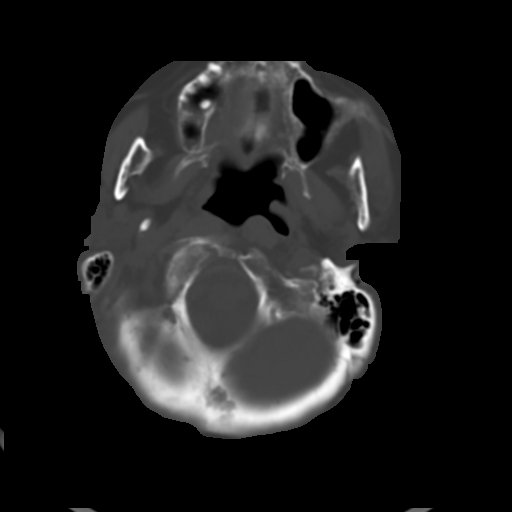
[im 5/30  brain]
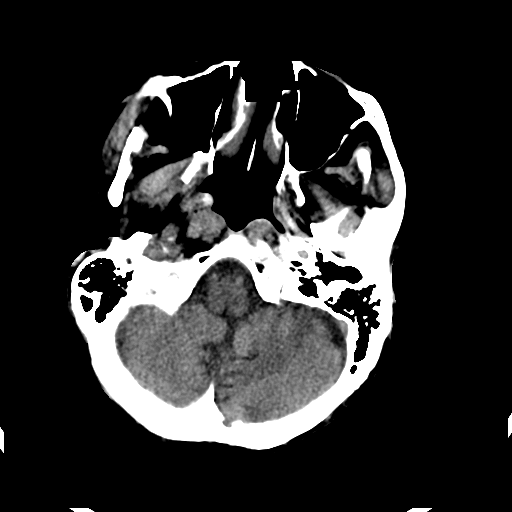
[im 7/30  brain]
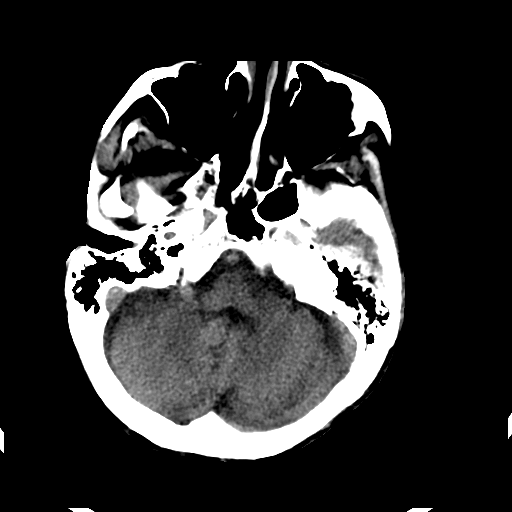
[im 9/30  brain]
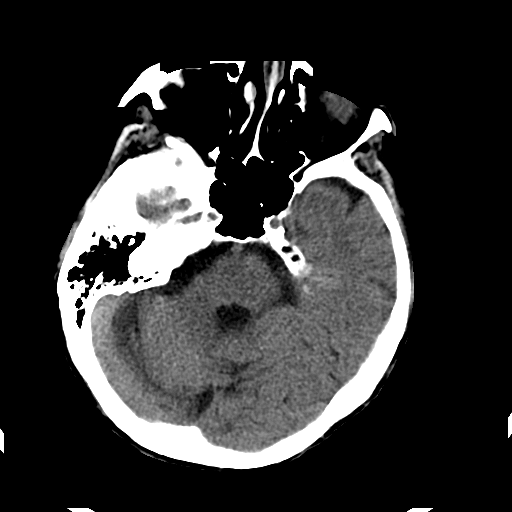
[im 11/30  brain]
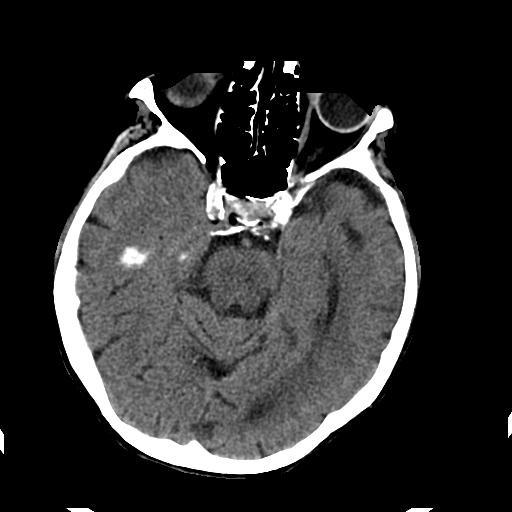
[im 11/30  bone]
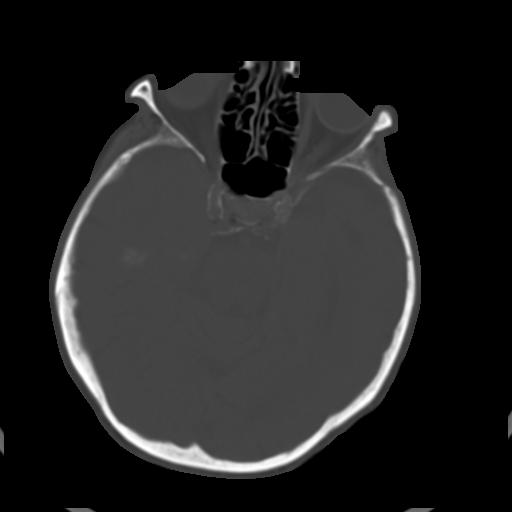
[im 13/30  brain]
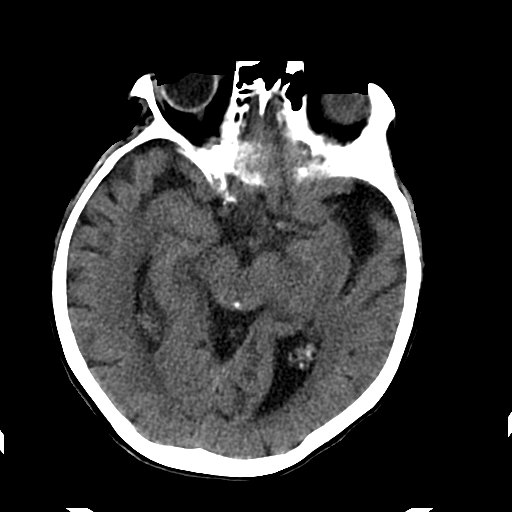
[im 15/30  brain]
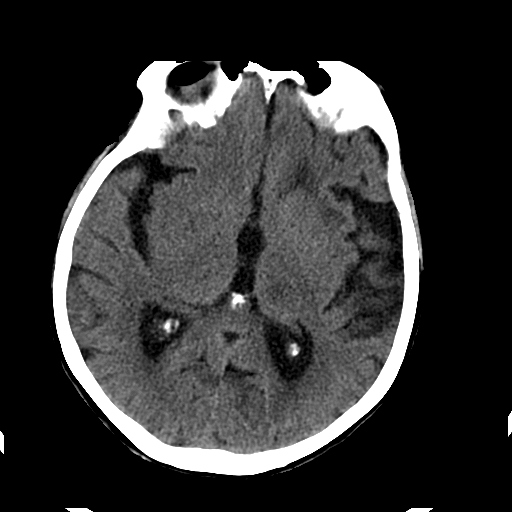
[im 17/30  brain]
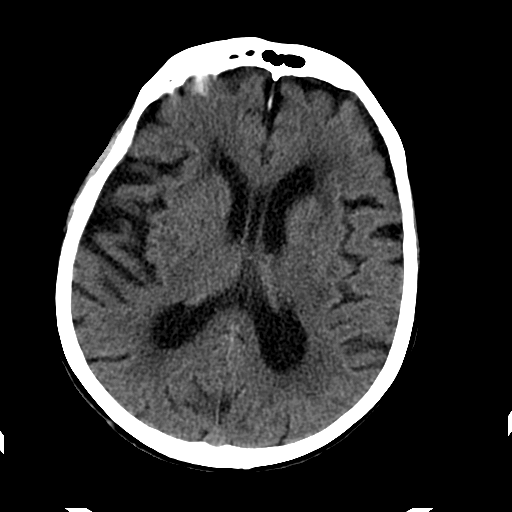
[im 19/30  brain]
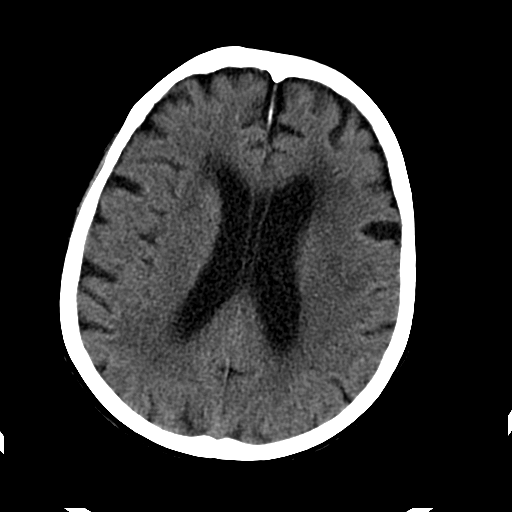
[im 19/30  bone]
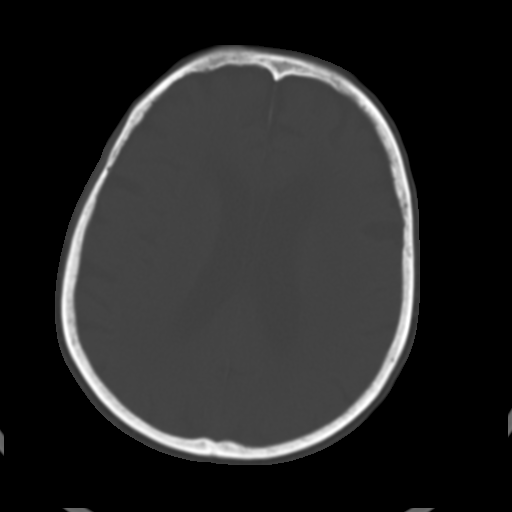
[im 21/30  brain]
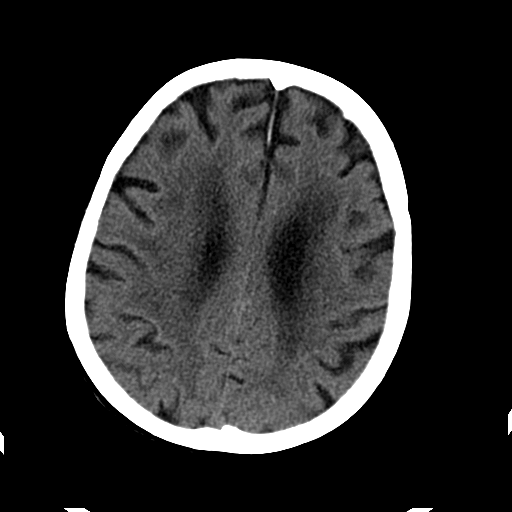
[im 23/30  brain]
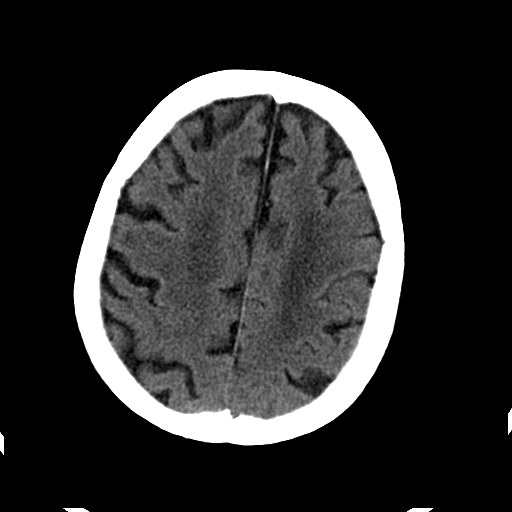
[im 25/30  brain]
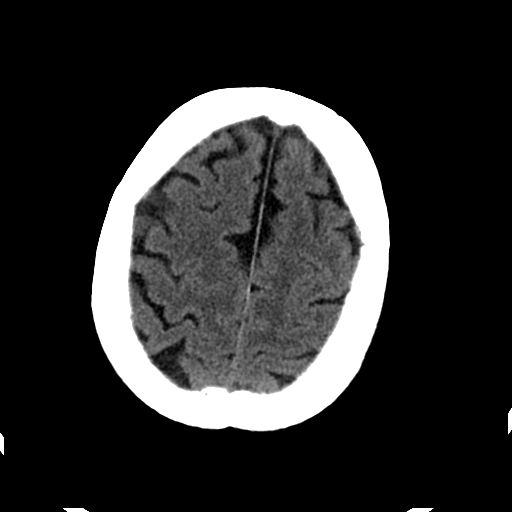
[im 27/30  brain]
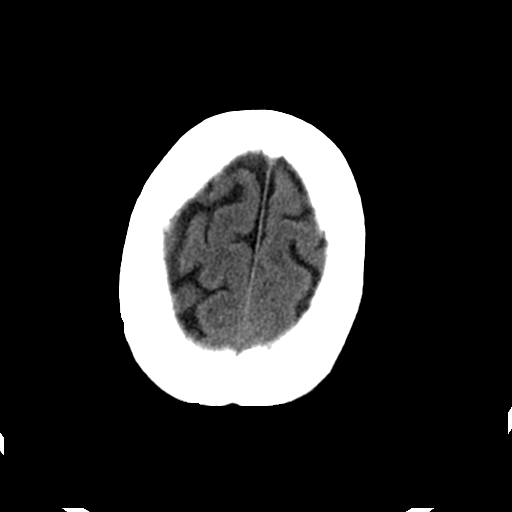
[im 27/30  bone]
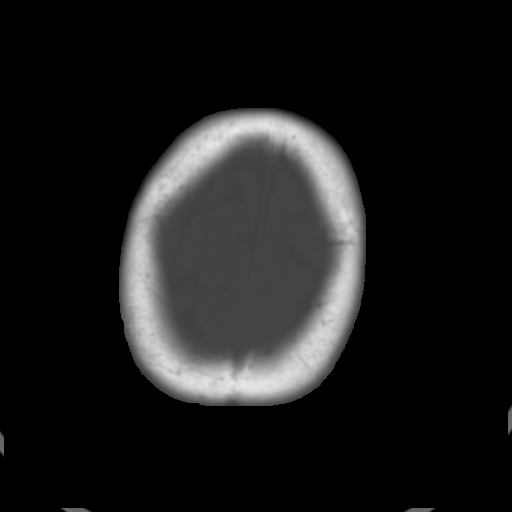

[Series 202: head w/o bone, idose (1) · axial · non-contrast · 0.42mm/px · z∈[+41,+61]mm · 2 of 30 slices shown]
[im 3/30  bone]
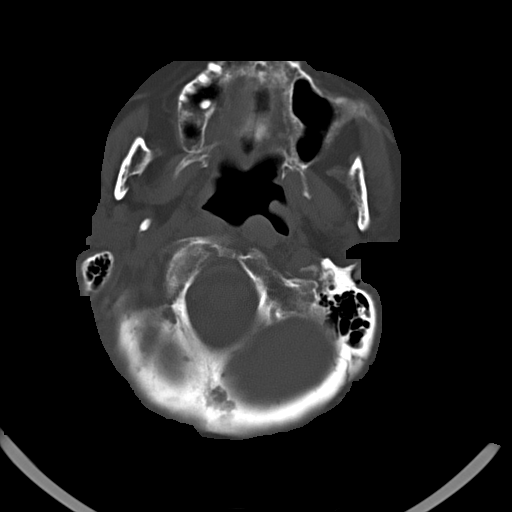
[im 7/30  bone]
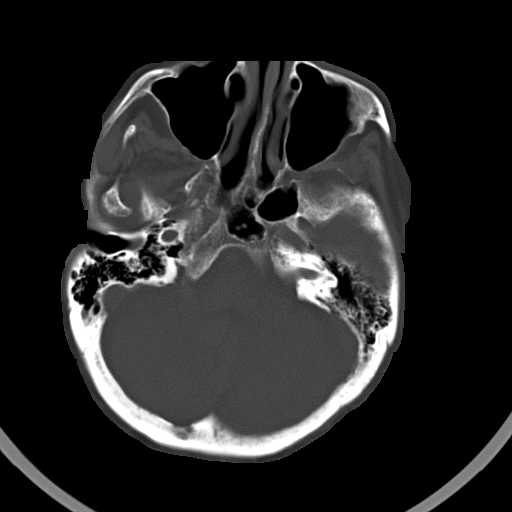

[15 of 30 positions shown; findings below may reference images not displayed]

FINDINGS: Visualized paranasal sinuses and mastoids are clear. No acute orbit
or scalp soft tissue finding. No acute osseous abnormality
identified.

Cerebral volume is stable and normal for age. Calcified
atherosclerosis at the skull base. No ventriculomegaly. No midline
shift, mass effect, or evidence of intracranial mass lesion. Stable
hypodensity in the left thalamus. Stable patchy bilateral cerebral
white matter hypodensity. No cortically based acute infarct
identified. No acute intracranial hemorrhage identified. No
suspicious intracranial vascular hyperdensity.
IMPRESSION: Stable noncontrast CT appearance of the brain. No acute intracranial
abnormality.
